# Patient Record
Sex: Female | Born: 1995 | Race: White | Hispanic: Yes | Marital: Single | Smoking: Former smoker
Health system: Southern US, Community
[De-identification: ages and names within clinical notes are randomized; demographics above are authoritative.]

## PROBLEM LIST (undated history)

## (undated) ENCOUNTER — Ambulatory Visit: Admission: EM | Source: Home / Self Care

## (undated) DIAGNOSIS — R51 Headache: Secondary | ICD-10-CM

## (undated) DIAGNOSIS — F32A Depression, unspecified: Secondary | ICD-10-CM

## (undated) DIAGNOSIS — F329 Major depressive disorder, single episode, unspecified: Secondary | ICD-10-CM

## (undated) DIAGNOSIS — L819 Disorder of pigmentation, unspecified: Secondary | ICD-10-CM

## (undated) DIAGNOSIS — S060X9A Concussion with loss of consciousness of unspecified duration, initial encounter: Secondary | ICD-10-CM

## (undated) DIAGNOSIS — S060XAA Concussion with loss of consciousness status unknown, initial encounter: Secondary | ICD-10-CM

## (undated) DIAGNOSIS — G43909 Migraine, unspecified, not intractable, without status migrainosus: Secondary | ICD-10-CM

## (undated) DIAGNOSIS — F419 Anxiety disorder, unspecified: Secondary | ICD-10-CM

## (undated) DIAGNOSIS — R519 Headache, unspecified: Secondary | ICD-10-CM

## (undated) HISTORY — DX: Depression, unspecified: F32.A

## (undated) HISTORY — PX: TONSILLECTOMY AND ADENOIDECTOMY: SUR1326

## (undated) HISTORY — DX: Anxiety disorder, unspecified: F41.9

## (undated) HISTORY — DX: Major depressive disorder, single episode, unspecified: F32.9

## (undated) HISTORY — DX: Concussion with loss of consciousness of unspecified duration, initial encounter: S06.0X9A

## (undated) HISTORY — DX: Headache, unspecified: R51.9

## (undated) HISTORY — PX: OTHER SURGICAL HISTORY: SHX169

## (undated) HISTORY — DX: Concussion with loss of consciousness status unknown, initial encounter: S06.0XAA

## (undated) HISTORY — DX: Headache: R51

## (undated) HISTORY — DX: Migraine, unspecified, not intractable, without status migrainosus: G43.909

## (undated) HISTORY — DX: Disorder of pigmentation, unspecified: L81.9

---
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
## 2017-09-27 DIAGNOSIS — G43001 Migraine without aura, not intractable, with status migrainosus: Secondary | ICD-10-CM | POA: Diagnosis not present

## 2017-09-27 DIAGNOSIS — Z23 Encounter for immunization: Secondary | ICD-10-CM | POA: Diagnosis not present

## 2017-09-27 DIAGNOSIS — E669 Obesity, unspecified: Secondary | ICD-10-CM | POA: Diagnosis not present

## 2017-09-27 DIAGNOSIS — Z0000 Encounter for general adult medical examination without abnormal findings: Secondary | ICD-10-CM | POA: Diagnosis not present

## 2017-09-27 DIAGNOSIS — Z68.41 Body mass index (BMI) pediatric, greater than or equal to 95th percentile for age: Secondary | ICD-10-CM | POA: Diagnosis not present

## 2018-02-06 ENCOUNTER — Other Ambulatory Visit: Payer: Self-pay

## 2018-02-06 ENCOUNTER — Ambulatory Visit (INDEPENDENT_AMBULATORY_CARE_PROVIDER_SITE_OTHER): Admitting: Family Medicine

## 2018-02-06 VITALS — BP 135/87 | HR 96 | Temp 97.9°F | Ht 61.0 in | Wt 160.0 lb

## 2018-02-06 DIAGNOSIS — R5383 Other fatigue: Secondary | ICD-10-CM | POA: Diagnosis not present

## 2018-02-06 DIAGNOSIS — G43909 Migraine, unspecified, not intractable, without status migrainosus: Secondary | ICD-10-CM

## 2018-02-06 DIAGNOSIS — M7918 Myalgia, other site: Secondary | ICD-10-CM

## 2018-02-06 DIAGNOSIS — K1379 Other lesions of oral mucosa: Secondary | ICD-10-CM

## 2018-02-06 DIAGNOSIS — Z7689 Persons encountering health services in other specified circumstances: Secondary | ICD-10-CM | POA: Diagnosis not present

## 2018-02-06 DIAGNOSIS — F331 Major depressive disorder, recurrent, moderate: Secondary | ICD-10-CM

## 2018-02-06 MED ORDER — NORTRIPTYLINE HCL 25 MG PO CAPS: 25.0000 mg | ORAL_CAPSULE | Freq: Every day | ORAL | 0 refills | Status: DC

## 2018-02-06 MED ORDER — BUPROPION HCL ER (XL) 150 MG PO TB24
150.0000 mg | ORAL_TABLET | Freq: Every day | ORAL | 1 refills | Status: DC
Start: 2018-02-06 — End: 2018-03-20

## 2018-02-06 NOTE — Progress Notes (Signed)
BP 135/87   Pulse 96   Temp 97.9 F (36.6 C) (Oral)   Ht 5\' 1"  (1.549 m)   Wt 160 lb (72.6 kg)   SpO2 100%   BMI 30.23 kg/m    Subjective:    Patient ID: Brooke [name], female    DOB: June 02, 1996, 22 y.o.   MRN: [id number]  HPI: Brooke [name] is a 22 y.o. female  Chief Complaint  Patient presents with  . New Patient (Initial Visit)    pt states have been tired, nausea, headache and not feeling good at all for the last 2 months. States she has been feeling anxious. pt have been taken OTC medications but not help   Pt presents today to establish care.   Several months of fatigue, nausea, lack of motivation. Does have a hx of depression and anxiety, used to take medications back in middle school. Tried zoloft with side effects in the past, states it made her feel jittery.   Only took the amitriptyline a few times which helped her migraines but was too sedating. Still having migraines at least once a week. Takes maxalt prn with some relief.   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   Has had 3 concussions in the past, goes to a NeuroChiropractor for this and was followed by a concussion specialist for a while.   Relevant past medical, surgical, family and social history reviewed and updated as indicated. Interim medical history since our last visit reviewed. Allergies and medications reviewed and updated.  Review of Systems  Per HPI unless specifically indicated above     Objective:    BP 135/87   Pulse 96   Temp 97.9 F (36.6 C) (Oral)   Ht 5\' 1"  (1.549 m)   Wt 160 lb (72.6 kg)   SpO2 100%   BMI 30.23 kg/m   Wt Readings from Last 3 Encounters:  02/06/18 160 lb (72.6 kg) (87 %, Z= 1.14)*   * Growth percentiles are based on CDC (Girls, 2-20 Years) data.    Physical Exam  Constitutional: She is oriented to person, place, and time. She appears well-developed and well-nourished.  HENT:  Head: Atraumatic.  Eyes: Conjunctivae and EOM are  normal.  Neck: Neck supple.  Cardiovascular: Normal rate and regular rhythm.  Pulmonary/Chest: Effort normal and breath sounds normal.  Abdominal: Soft. Bowel sounds are normal. There is no tenderness.  Musculoskeletal: Normal range of motion.  Neurological: She is alert and oriented to person, place, and time. No cranial nerve deficit.  Skin: Skin is warm and dry.  Psychiatric: She has a normal mood and affect. Her behavior is normal.  Nursing note and vitals reviewed.   Results for orders placed or performed in visit on 02/06/18  CBC with Differential/Platelet  Result Value Ref Range   WBC 6.8 3.4 - 10.8 x10E3/uL   RBC 5.00 3.77 - 5.28 x10E6/uL   Hemoglobin 14.5 11.1 - 15.9 g/dL   Hematocrit 08.6 57.8 - 46.6 %   MCV 89 79 - 97 fL   MCH 29.0 26.6 - 33.0 pg   MCHC 32.5 31.5 - 35.7 g/dL   RDW 46.9 62.9 - 52.8 %   Platelets 294 150 - 450 x10E3/uL   Neutrophils 64 Not Estab. %   Lymphs 25 Not Estab. %   Monocytes 6 Not Estab. %   Eos 5 Not Estab. %   Basos 0 Not Estab. %   Neutrophils Absolute 4.4 1.4 - 7.0  x10E3/uL   Lymphocytes Absolute 1.7 0.7 - 3.1 x10E3/uL   Monocytes Absolute 0.4 0.1 - 0.9 x10E3/uL   EOS (ABSOLUTE) 0.3 0.0 - 0.4 x10E3/uL   Basophils Absolute 0.0 0.0 - 0.2 x10E3/uL   Immature Granulocytes 0 Not Estab. %   Immature Grans (Abs) 0.0 0.0 - 0.1 x10E3/uL  TSH  Result Value Ref Range   TSH 1.470 0.450 - 4.500 uIU/mL  Rocky mtn spotted fvr abs pnl(IgG+IgM)  Result Value Ref Range   RMSF IgG Negative Negative   RMSF IgM 1.16 (H) 0.00 - 0.89 index  Lyme Ab/Western Blot Reflex  Result Value Ref Range   Lyme IgG/IgM Ab <0.91 0.00 - 0.90 ISR   LYME DISEASE AB, QUANT, IGM <0.80 0.00 - 0.79 index  Vitamin D (25 hydroxy)  Result Value Ref Range   Vit D, 25-Hydroxy 15.7 (L) 30.0 - 100.0 ng/mL  Vitamin B12  Result Value Ref Range   Vitamin B-12 448 232 - 1,245 pg/mL  HSV(herpes simplex vrs) 1+2 ab-IgG  Result Value Ref Range   HSV 1 Glycoprotein G Ab, IgG <0.91  0.00 - 0.90 index   HSV 2 IgG, Type Spec 3.19 (H) 0.00 - 0.90 index  HSV-2 IgG Supplemental Test  Result Value Ref Range   HSV-2 IgG Supplemental Test Negative Negative      Assessment & Plan:   Problem List Items Addressed This Visit      Cardiovascular and Mediastinum   Migraine    Will switch to nortriptyline and monitor for sedation. Continue prn maxalt and phenergan for migraines      Relevant Medications   rizatriptan (MAXALT-MLT) 5 MG disintegrating tablet   acetaminophen (TYLENOL) 325 MG tablet   naproxen (NAPROSYN) 500 MG tablet   nortriptyline (PAMELOR) 25 MG capsule   buPROPion (WELLBUTRIN XL) 150 MG 24 hr tablet     Other   Amplified musculoskeletal pain syndrome    Managed with OTC pain relievers and exercise       Depression    Start wellbutrin. Recommended counseling, pt declines      Relevant Medications   nortriptyline (PAMELOR) 25 MG capsule   buPROPion (WELLBUTRIN XL) 150 MG 24 hr tablet    Other Visit Diagnoses    Fatigue, unspecified type    -  Primary   Unclear etiology, suspect her depression causing but will get labs to r/o organic causes. Start wellbutrin and adjust therapy depending on lab results   Relevant Orders   CBC with Differential/Platelet (Completed)   TSH (Completed)   Rocky mtn spotted fvr abs pnl(IgG+IgM) (Completed)   Lyme Ab/Western Blot Reflex (Completed)   Vitamin D (25 hydroxy) (Completed)   Vitamin B12 (Completed)   Encounter to establish care       Mouth sore       Noted on exam. Will check for HSV and tx if needed. Vaseline for protective barrier in meantime   Relevant Orders   HSV(herpes simplex vrs) 1+2 ab-IgG (Completed)       Follow up plan: Return in about 4 weeks (around 03/06/2018) for Mood f/u.

## 2018-02-08 ENCOUNTER — Other Ambulatory Visit: Payer: Self-pay | Admitting: Family Medicine

## 2018-02-08 LAB — TSH: TSH: 1.47 u[IU]/mL (ref 0.450–4.500)

## 2018-02-08 LAB — LYME AB/WESTERN BLOT REFLEX: Lyme IgG/IgM Ab: <0.91 {ISR} (ref 0.00–0.90)

## 2018-02-08 LAB — CBC WITH DIFFERENTIAL/PLATELET
BASOS ABS: 0 10*3/uL (ref 0.0–0.2)
Basos: 0 %
EOS (ABSOLUTE): 0.3 10*3/uL (ref 0.0–0.4)
Eos: 5 %
Hematocrit: 44.6 % (ref 34.0–46.6)
Hemoglobin: 14.5 g/dL (ref 11.1–15.9)
IMMATURE GRANS (ABS): 0 10*3/uL (ref 0.0–0.1)
Immature Granulocytes: 0 %
LYMPHS: 25 %
Lymphocytes Absolute: 1.7 10*3/uL (ref 0.7–3.1)
MCH: 29 pg (ref 26.6–33.0)
MCHC: 32.5 g/dL (ref 31.5–35.7)
MCV: 89 fL (ref 79–97)
MONOCYTES: 6 %
Monocytes Absolute: 0.4 10*3/uL (ref 0.1–0.9)
NEUTROS ABS: 4.4 10*3/uL (ref 1.4–7.0)
Neutrophils: 64 %
Platelets: 294 10*3/uL (ref 150–450)
RBC: 5 x10E6/uL (ref 3.77–5.28)
RDW: 13 % (ref 12.3–15.4)
WBC: 6.8 10*3/uL (ref 3.4–10.8)

## 2018-02-08 LAB — HSV(HERPES SIMPLEX VRS) I + II AB-IGG
HSV 1 Glycoprotein G Ab, IgG: <0.91 index (ref 0.00–0.90)
HSV 2 IgG, Type Spec: 3.19 index — ABNORMAL HIGH (ref 0.00–0.90)

## 2018-02-08 LAB — VITAMIN B12: Vitamin B-12: 448 pg/mL (ref 232–1245)

## 2018-02-08 LAB — ROCKY MTN SPOTTED FVR ABS PNL(IGG+IGM)
RMSF IGG: NEGATIVE
RMSF IGM: 1.16 {index} — AB (ref 0.00–0.89)

## 2018-02-08 LAB — VITAMIN D 25 HYDROXY (VIT D DEFICIENCY, FRACTURES): VIT D 25 HYDROXY: 15.7 ng/mL — AB (ref 30.0–100.0)

## 2018-02-08 LAB — HSV-2 IGG SUPPLEMENTAL TEST: HSV-2 IGG SUPPLEMENTAL TEST: NEGATIVE

## 2018-02-08 MED ORDER — DOXYCYCLINE HYCLATE 100 MG PO TABS: 100.0000 mg | ORAL_TABLET | Freq: Two times a day (BID) | ORAL | 0 refills | Status: DC

## 2018-02-08 MED ORDER — VITAMIN D (ERGOCALCIFEROL) 1.25 MG (50000 UNIT) PO CAPS: 50000.0000 [IU] | ORAL_CAPSULE | ORAL | 0 refills | Status: DC

## 2018-02-09 DIAGNOSIS — G43909 Migraine, unspecified, not intractable, without status migrainosus: Secondary | ICD-10-CM | POA: Insufficient documentation

## 2018-02-09 DIAGNOSIS — F32A Depression, unspecified: Secondary | ICD-10-CM | POA: Insufficient documentation

## 2018-02-09 DIAGNOSIS — F329 Major depressive disorder, single episode, unspecified: Secondary | ICD-10-CM | POA: Insufficient documentation

## 2018-02-09 NOTE — Assessment & Plan Note (Signed)
Start wellbutrin. Recommended counseling, pt declines

## 2018-02-09 NOTE — Assessment & Plan Note (Signed)
Will switch to nortriptyline and monitor for sedation. Continue prn maxalt and phenergan for migraines

## 2018-02-09 NOTE — Patient Instructions (Signed)
Follow up in 1 month   

## 2018-02-09 NOTE — Assessment & Plan Note (Signed)
Managed with OTC pain relievers and exercise

## 2018-02-13 ENCOUNTER — Encounter: Payer: Self-pay | Admitting: Family Medicine

## 2018-02-20 ENCOUNTER — Telehealth: Payer: Self-pay

## 2018-02-20 NOTE — Telephone Encounter (Signed)
Copied from CRM [phone]. Topic: General - Other >> Feb 20, 2018 10:18 AM Elliot GaultBell, Tiffany M wrote: Relation to pt: self  Call back number: [phone]  Reason for call:  Patient returning call regarding lab results, patient chart has been updated with correct #, please advise

## 2018-02-20 NOTE — Telephone Encounter (Signed)
We have sent patient a letter. Will call patient about her lab results.

## 2018-02-21 MED ORDER — DOXYCYCLINE HYCLATE 100 MG PO TABS: 100.0000 mg | ORAL_TABLET | Freq: Two times a day (BID) | ORAL | 0 refills | Status: DC

## 2018-02-21 MED ORDER — VITAMIN D (ERGOCALCIFEROL) 1.25 MG (50000 UNIT) PO CAPS: 50000.0000 [IU] | ORAL_CAPSULE | ORAL | 0 refills | Status: DC

## 2018-02-21 NOTE — Telephone Encounter (Signed)
Tried calling pt twice, no answer and no VM box.

## 2018-02-21 NOTE — Telephone Encounter (Signed)
Patient called in and stated that she has questions about the lab results. I read the results from the labs and letter sent to patient. Patient and her girlfriend were both on the line and stated that they do not understand how the patient got the RMSF because they state that they have done some research on this. They state that the patient has had the symptoms longer than it states from RMSF so they do not understand where it came from. I asked if she had a tick on her or anything and she stated not that they know of. Patient and her girlfriend requested to speak directly to Fleet ContrasRachel to discuss results better. They would also like the doxycycline, vitamin D, and a cold sore virus medication [facility] [place] purposes. Patient states that she has been having some cold sores in her mouth. Pharmacy updated in chart.

## 2018-03-15 ENCOUNTER — Ambulatory Visit: Payer: Self-pay | Admitting: Family Medicine

## 2018-03-20 ENCOUNTER — Encounter: Payer: Self-pay | Admitting: Family Medicine

## 2018-03-20 ENCOUNTER — Ambulatory Visit (INDEPENDENT_AMBULATORY_CARE_PROVIDER_SITE_OTHER): Payer: Medicaid Other | Admitting: Family Medicine

## 2018-03-20 VITALS — BP 128/76 | HR 101 | Temp 98.3°F | Ht 61.0 in | Wt 168.9 lb

## 2018-03-20 DIAGNOSIS — Z23 Encounter for immunization: Secondary | ICD-10-CM

## 2018-03-20 DIAGNOSIS — F331 Major depressive disorder, recurrent, moderate: Secondary | ICD-10-CM | POA: Diagnosis not present

## 2018-03-20 DIAGNOSIS — G43909 Migraine, unspecified, not intractable, without status migrainosus: Secondary | ICD-10-CM | POA: Diagnosis not present

## 2018-03-20 DIAGNOSIS — E559 Vitamin D deficiency, unspecified: Secondary | ICD-10-CM | POA: Diagnosis not present

## 2018-03-20 MED ORDER — RIZATRIPTAN BENZOATE 5 MG PO TBDP: ORAL_TABLET | ORAL | 6 refills | Status: DC

## 2018-03-20 MED ORDER — NORTRIPTYLINE HCL 50 MG PO CAPS: 50.0000 mg | ORAL_CAPSULE | Freq: Every day | ORAL | 3 refills | Status: DC

## 2018-03-20 MED ORDER — BUSPIRONE HCL 7.5 MG PO TABS: 7.5000 mg | ORAL_TABLET | Freq: Two times a day (BID) | ORAL | 1 refills | Status: DC

## 2018-03-20 MED ORDER — PROMETHAZINE HCL 25 MG PO TABS: 25.0000 mg | ORAL_TABLET | Freq: Three times a day (TID) | ORAL | 6 refills | Status: DC | PRN

## 2018-03-20 NOTE — Assessment & Plan Note (Signed)
Doing well on vitamin D supplementation, will recheck levels at CPE

## 2018-03-20 NOTE — Telephone Encounter (Signed)
Patient here 03/20/2018 for appointment.  Patient had telephone problems for a while which is why we were unable to contact her.   Questions and results reviewed at today's visit.

## 2018-03-20 NOTE — Assessment & Plan Note (Signed)
Increase nortriptyline and refill phenergan and maxalt. Call if not improving

## 2018-03-20 NOTE — Progress Notes (Signed)
BP 128/76 (BP Location: Right Arm, Patient Position: Sitting, Cuff Size: Normal)   Pulse (!) 101   Temp 98.3 F (36.8 C) (Oral)   Ht 5\' 1"  (1.549 m)   Wt 168 lb 14.4 oz (76.6 kg)   SpO2 100%   BMI 31.91 kg/m    Subjective:    Patient ID: Brooke [name], female    DOB: [birth year], 22 y.o.   MRN: [id number]  HPI: Brooke [name] is a 22 y.o. female  Chief Complaint  Patient presents with  . Depression    No changes. Patient stated Wellbutrin made her so anxious she couldn't go back to work.   Marland Kitchen RMSF    Had questions (see telephone encounter) about RMSF because she doesn't remember getting bit by a tick. Patient had telephone problems.    Here today for 1 month f/u. Feeling some better overall as far as moods and energy level, working less and stopped going to school. Started exercising and eating better. Started the vitamin D supplement. Taking the doxyxycline but not regularly so stil lhas some left for the positive RMSF test and thinks this may be helping.   Started nortriptyline, less frequent migraines but 10/10 when happening. Thinks it's because she's out of maxalt and phenergan as well though. Denies visual issues, syncope, hearing issues, inability to get rid of headaches.   Tried 3 days of wellbutrin but became so anxious on it that she could not even go to work. Stopped the medication and has just been working on lifestyle changes to help with anxiety and depression. Is interested in trying something else.   Depression screen [facility] 2/9 03/20/2018 02/06/2018  Decreased Interest 0 1  Down, Depressed, Hopeless 1 1  PHQ - 2 Score 1 2  Altered sleeping 1 1  Tired, decreased energy 2 2  Change in appetite 0 0  Feeling bad or failure about yourself  0 2  Trouble concentrating 1 0  Moving slowly or fidgety/restless 0 0  Suicidal thoughts 0 0  PHQ-9 Score 5 7   GAD 7 : Generalized Anxiety Score 03/20/2018 02/06/2018  Nervous, Anxious, on Edge 0 1  Control/stop worrying 1 1    Worry too much - different things 1 1  Trouble relaxing 1 1  Restless 0 0  Easily annoyed or irritable 1 1  Afraid - awful might happen 0 0  Total GAD 7 Score 4 5     Past Medical History:  Diagnosis Date  . Anxiety   . Concussion   . Depression   . Dyschromia   . Head ache   . Migraine    Social History   Socioeconomic History  . Marital status: Single    Spouse name: Not on file  . Number of children: Not on file  . Years of education: Not on file  . Highest education level: Not on file  Occupational History  . Not on file  Social Needs  . Financial resource strain: Not on file  . Food insecurity:    Worry: Not on file    Inability: Not on file  . Transportation needs:    Medical: Not on file    Non-medical: Not on file  Tobacco Use  . Smoking status: Former Games developer  . Smokeless tobacco: Never Used  Substance and Sexual Activity  . Alcohol use: Yes  . Drug use: Yes  . Sexual activity: Yes  Lifestyle  . Physical activity:    Days per week: Not on file  Minutes per session: Not on file  . Stress: Not on file  Relationships  . Social connections:    Talks on phone: Not on file    Gets together: Not on file    Attends religious service: Not on file    Active member of club or organization: Not on file    Attends meetings of clubs or organizations: Not on file    Relationship status: Not on file  . Intimate partner violence:    Fear of current or ex partner: Not on file    Emotionally abused: Not on file    Physically abused: Not on file    Forced sexual activity: Not on file  Other Topics Concern  . Not on file  Social History Narrative  . Not on file    Relevant past medical, surgical, family and social history reviewed and updated as indicated. Interim medical history since our last visit reviewed. Allergies and medications reviewed and updated.  Review of Systems  Per HPI unless specifically indicated above     Objective:    BP 128/76  (BP Location: Right Arm, Patient Position: Sitting, Cuff Size: Normal)   Pulse (!) 101   Temp 98.3 F (36.8 C) (Oral)   Ht 5\' 1"  (1.549 m)   Wt 168 lb 14.4 oz (76.6 kg)   SpO2 100%   BMI 31.91 kg/m   Wt Readings from Last 3 Encounters:  03/20/18 168 lb 14.4 oz (76.6 kg) (91 %, Z= 1.35)*  02/06/18 160 lb (72.6 kg) (87 %, Z= 1.14)*   * Growth percentiles are based on CDC (Girls, 2-20 Years) data.    Physical Exam  Constitutional: She is oriented to person, place, and time. She appears well-developed and well-nourished. No distress.  HENT:  Head: Atraumatic.  Eyes: Conjunctivae and EOM are normal.  Neck: Normal range of motion. Neck supple.  Cardiovascular: Normal rate, regular rhythm and normal heart sounds.  Pulmonary/Chest: Effort normal and breath sounds normal.  Musculoskeletal: Normal range of motion. She exhibits no edema or deformity.  Neurological: She is alert and oriented to person, place, and time.  Skin: Skin is warm and dry.  Psychiatric: She has a normal mood and affect. Her behavior is normal.  Nursing note and vitals reviewed.   Results for orders placed or performed in visit on 02/06/18  CBC with Differential/Platelet  Result Value Ref Range   WBC 6.8 3.4 - 10.8 x10E3/uL   RBC 5.00 3.77 - 5.28 x10E6/uL   Hemoglobin 14.5 11.1 - 15.9 g/dL   Hematocrit 86.5 78.4 - 46.6 %   MCV 89 79 - 97 fL   MCH 29.0 26.6 - 33.0 pg   MCHC 32.5 31.5 - 35.7 g/dL   RDW 69.6 29.5 - 28.4 %   Platelets 294 150 - 450 x10E3/uL   Neutrophils 64 Not Estab. %   Lymphs 25 Not Estab. %   Monocytes 6 Not Estab. %   Eos 5 Not Estab. %   Basos 0 Not Estab. %   Neutrophils Absolute 4.4 1.4 - 7.0 x10E3/uL   Lymphocytes Absolute 1.7 0.7 - 3.1 x10E3/uL   Monocytes Absolute 0.4 0.1 - 0.9 x10E3/uL   EOS (ABSOLUTE) 0.3 0.0 - 0.4 x10E3/uL   Basophils Absolute 0.0 0.0 - 0.2 x10E3/uL   Immature Granulocytes 0 Not Estab. %   Immature Grans (Abs) 0.0 0.0 - 0.1 x10E3/uL  TSH  Result Value Ref  Range   TSH 1.470 0.450 - 4.500 uIU/mL  Rocky mtn spotted  fvr abs pnl(IgG+IgM)  Result Value Ref Range   RMSF IgG Negative Negative   RMSF IgM 1.16 (H) 0.00 - 0.89 index  Lyme Ab/Western Blot Reflex  Result Value Ref Range   Lyme IgG/IgM Ab <0.91 0.00 - 0.90 ISR   LYME DISEASE AB, QUANT, IGM <0.80 0.00 - 0.79 index  Vitamin D (25 hydroxy)  Result Value Ref Range   Vit D, 25-Hydroxy 15.7 (L) 30.0 - 100.0 ng/mL  Vitamin B12  Result Value Ref Range   Vitamin B-12 448 232 - 1,245 pg/mL  HSV(herpes simplex vrs) 1+2 ab-IgG  Result Value Ref Range   HSV 1 Glycoprotein G Ab, IgG <0.91 0.00 - 0.90 index   HSV 2 IgG, Type Spec 3.19 (H) 0.00 - 0.90 index  HSV-2 IgG Supplemental Test  Result Value Ref Range   HSV-2 IgG Supplemental Test Negative Negative      Assessment & Plan:   Problem List Items Addressed This Visit      Cardiovascular and Mediastinum   Migraine    Increase nortriptyline and refill phenergan and maxalt. Call if not improving      Relevant Medications   nortriptyline (PAMELOR) 50 MG capsule   rizatriptan (MAXALT-MLT) 5 MG disintegrating tablet     Other   Depression    Did not tolerate wellbutrin. Will try buspar and monitor closely. Risks and benefits reviewed. Counseling recommended, pt will consider      Relevant Medications   busPIRone (BUSPAR) 7.5 MG tablet   nortriptyline (PAMELOR) 50 MG capsule   Vitamin D deficiency    Doing well on vitamin D supplementation, will recheck levels at CPE       Other Visit Diagnoses    Need for influenza vaccination    -  Primary   Relevant Orders   Flu Vaccine QUAD 6+ mos PF IM (Fluarix Quad PF) (Completed)       Follow up plan: Return in about 4 weeks (around 04/17/2018) for Mood f/u.

## 2018-03-20 NOTE — Assessment & Plan Note (Signed)
Did not tolerate wellbutrin. Will try buspar and monitor closely. Risks and benefits reviewed. Counseling recommended, pt will consider

## 2018-03-20 NOTE — Patient Instructions (Signed)

## 2018-04-19 ENCOUNTER — Ambulatory Visit: Admitting: Family Medicine

## 2018-05-01 ENCOUNTER — Ambulatory Visit: Admitting: Family Medicine

## 2018-05-02 ENCOUNTER — Encounter: Payer: Self-pay | Admitting: Family Medicine

## 2018-07-09 ENCOUNTER — Telehealth: Payer: Self-pay | Admitting: Family Medicine

## 2018-07-09 DIAGNOSIS — G43909 Migraine, unspecified, not intractable, without status migrainosus: Secondary | ICD-10-CM

## 2018-07-09 NOTE — Telephone Encounter (Signed)
Pt states she needs a new Neurology referral for insurance purposes to Dr. Hughie Closs at [facility]. Already has an appt coming up just needs the referral.

## 2018-10-22 ENCOUNTER — Telehealth: Payer: Self-pay | Admitting: Family Medicine

## 2018-10-22 NOTE — Telephone Encounter (Signed)
Can you check on the referral on this patient. I received a call from [facility] stating the referrals should be going to [facility]. She states the patients mother is upset that this has been happening more than once.  [phone] [facility]  Can you please check on this   Thank you

## 2018-10-22 NOTE — Telephone Encounter (Signed)
Thank you :)

## 2018-10-22 NOTE — Telephone Encounter (Signed)
Referral re faxed to Dr Hughie Closs

## 2018-12-14 ENCOUNTER — Other Ambulatory Visit: Payer: Self-pay

## 2018-12-14 ENCOUNTER — Ambulatory Visit (INDEPENDENT_AMBULATORY_CARE_PROVIDER_SITE_OTHER): Admitting: Family Medicine

## 2018-12-14 ENCOUNTER — Encounter: Payer: Self-pay | Admitting: Family Medicine

## 2018-12-14 VITALS — HR 66 | Ht 61.3 in | Wt 153.0 lb

## 2018-12-14 DIAGNOSIS — G43909 Migraine, unspecified, not intractable, without status migrainosus: Secondary | ICD-10-CM | POA: Diagnosis not present

## 2018-12-14 DIAGNOSIS — G4700 Insomnia, unspecified: Secondary | ICD-10-CM

## 2018-12-14 DIAGNOSIS — F331 Major depressive disorder, recurrent, moderate: Secondary | ICD-10-CM

## 2018-12-14 MED ORDER — PROMETHAZINE HCL 25 MG PO TABS: 25.0000 mg | ORAL_TABLET | Freq: Three times a day (TID) | ORAL | 6 refills | Status: DC | PRN

## 2018-12-14 MED ORDER — TOPIRAMATE 25 MG PO TABS: 25.0000 mg | ORAL_TABLET | Freq: Two times a day (BID) | ORAL | 0 refills | Status: DC

## 2018-12-14 MED ORDER — DULOXETINE HCL 20 MG PO CPEP: 20.0000 mg | ORAL_CAPSULE | Freq: Every day | ORAL | 0 refills | Status: DC

## 2018-12-14 MED ORDER — QUETIAPINE FUMARATE 50 MG PO TABS: 50.0000 mg | ORAL_TABLET | Freq: Every day | ORAL | 0 refills | Status: DC

## 2018-12-14 MED ORDER — RIZATRIPTAN BENZOATE 5 MG PO TBDP: ORAL_TABLET | ORAL | 6 refills | Status: DC

## 2018-12-14 NOTE — Progress Notes (Signed)
Pulse 66    Ht 5' 1.3" (1.557 m)    Wt 153 lb (69.4 kg)    BMI 28.63 kg/m    Subjective:    Patient ID: Brooke [name], female    DOB: [birth year], 23 y.o.   MRN: [id number]  HPI: Brooke Ribasrianna Bresnan is a 23 y.o. female  Chief Complaint  Patient presents with   Depression    pt states she would like to switche the buspirone for something else. states she has not been taking it for about a moth ago     This visit was completed via WebEx due to the restrictions of the COVID-19 pandemic. All issues as above were discussed and addressed. Physical exam was done as above through visual confirmation on WebEx. If it was felt that the patient should be evaluated in the office, they were directed there. The patient verbally consented to this visit.  Location of the patient: home  Location of the provider: work  Those involved with this call:   Provider: Roosvelt Maserachel Jermichael Belmares, PA-C  CMA: Elton SinAnita Quito, CMA  Front Desk/Registration: Harriet PhoJoliza Johnson   Time spent on call: 45 minutes with patient face to face via video conference. More than 50% of this time was spent in counseling and coordination of care. 10 minutes total spent in review of patient's record and preparation of their chart. I verified patient identity using two factors (patient name and date of birth). Patient consents verbally to being seen via telemedicine visit today.   Patient here to discuss multiple issues and wanting to change most of her medications around as she feels her current regimen is not working well for her.   Depression - Stopped the buspirone about a month ago because it was making her feel numb. It did a good job controlling her anxiety, but made her lose her emotions which scared her. Sees a counselor who thinks she needs a new mood stabilizer, antidepressant, and sleep aid. Has been on zoloft - thinks this caused SI as a child, wellbutrin, and can't remember but thinks one or two other antidepressants in the past. Moods  worse since having to put down their older dog a week or so ago. Denies SI/HI, severe mood swings.   Nortriptyline does help with migraines but not with sleep. Notes she will be up until about 3 am or so many nights just laying there worrying and then be up again several hours later when her partner wakes for the day. That along with maxalt prn and phenergan have been controlling her migraines well. Interested in trying something else for sleep. Ambien causes hallucinations, does not want anything like that.   [facility] 12/14/2018 03/20/2018 02/06/2018  Decreased Interest 2 0 1  Down, Depressed, Hopeless 1 1 1   PHQ - 2 Score 3 1 2   Altered sleeping 3 1 1   Tired, decreased energy 2 2 2   Change in appetite 1 0 0  Feeling bad or failure about yourself  1 0 2  Trouble concentrating 1 1 0  Moving slowly or fidgety/restless 0 0 0  Suicidal thoughts 0 0 0  PHQ-9 Score 11 5 7   Difficult doing work/chores Somewhat difficult - -   GAD 7 : Generalized Anxiety Score 03/20/2018 02/06/2018  Nervous, Anxious, on Edge 0 1  Control/stop worrying 1 1  Worry too much - different things 1 1  Trouble relaxing 1 1  Restless 0 0  Easily annoyed or irritable 1 1  Afraid - awful  might happen 0 0  Total GAD 7 Score 4 5   Relevant past medical, surgical, family and social history reviewed and updated as indicated. Interim medical history since our last visit reviewed. Allergies and medications reviewed and updated.  Review of Systems  Per HPI unless specifically indicated above     Objective:    Pulse 66    Ht 5' 1.3" (1.557 m)    Wt 153 lb (69.4 kg)    BMI 28.63 kg/m   Wt Readings from Last 3 Encounters:  12/14/18 153 lb (69.4 kg)  03/20/18 168 lb 14.4 oz (76.6 kg) (91 %, Z= 1.35)*  02/06/18 160 lb (72.6 kg) (87 %, Z= 1.14)*   * Growth percentiles are based on CDC (Girls, 2-20 Years) data.    Physical Exam Vitals signs and nursing note reviewed.  Constitutional:      General: She  is not in acute distress.    Appearance: Normal appearance.  HENT:     Head: Atraumatic.     Right Ear: External ear normal.     Left Ear: External ear normal.     Nose: Nose normal. No congestion.     Mouth/Throat:     Mouth: Mucous membranes are moist.     Pharynx: Oropharynx is clear. No posterior oropharyngeal erythema.  Eyes:     Extraocular Movements: Extraocular movements intact.     Conjunctiva/sclera: Conjunctivae normal.  Neck:     Musculoskeletal: Normal range of motion.  Cardiovascular:     Comments: Unable to assess via virtual visit Pulmonary:     Effort: Pulmonary effort is normal. No respiratory distress.  Musculoskeletal: Normal range of motion.  Skin:    General: Skin is dry.     Findings: No erythema.  Neurological:     Mental Status: She is alert and oriented to person, place, and time.  Psychiatric:        Mood and Affect: Mood normal.        Thought Content: Thought content normal.        Judgment: Judgment normal.     Results for orders placed or performed in visit on 02/06/18  CBC with Differential/Platelet  Result Value Ref Range   WBC 6.8 3.4 - 10.8 x10E3/uL   RBC 5.00 3.77 - 5.28 x10E6/uL   Hemoglobin 14.5 11.1 - 15.9 g/dL   Hematocrit 29.544.6 62.134.0 - 46.6 %   MCV 89 79 - 97 fL   MCH 29.0 26.6 - 33.0 pg   MCHC 32.5 31.5 - 35.7 g/dL   RDW 30.813.0 65.712.3 - 84.615.4 %   Platelets 294 150 - 450 x10E3/uL   Neutrophils 64 Not Estab. %   Lymphs 25 Not Estab. %   Monocytes 6 Not Estab. %   Eos 5 Not Estab. %   Basos 0 Not Estab. %   Neutrophils Absolute 4.4 1.4 - 7.0 x10E3/uL   Lymphocytes Absolute 1.7 0.7 - 3.1 x10E3/uL   Monocytes Absolute 0.4 0.1 - 0.9 x10E3/uL   EOS (ABSOLUTE) 0.3 0.0 - 0.4 x10E3/uL   Basophils Absolute 0.0 0.0 - 0.2 x10E3/uL   Immature Granulocytes 0 Not Estab. %   Immature Grans (Abs) 0.0 0.0 - 0.1 x10E3/uL  TSH  Result Value Ref Range   TSH 1.470 0.450 - 4.500 uIU/mL  Rocky mtn spotted fvr abs pnl(IgG+IgM)  Result Value Ref  Range   RMSF IgG Negative Negative   RMSF IgM 1.16 (H) 0.00 - 0.89 index  Lyme Ab/Western Blot Reflex  Result  Value Ref Range   Lyme IgG/IgM Ab <0.91 0.00 - 0.90 ISR   LYME DISEASE AB, QUANT, IGM <0.80 0.00 - 0.79 index  Vitamin D (25 hydroxy)  Result Value Ref Range   Vit D, 25-Hydroxy 15.7 (L) 30.0 - 100.0 ng/mL  Vitamin B12  Result Value Ref Range   Vitamin B-12 448 232 - 1,245 pg/mL  HSV(herpes simplex vrs) 1+2 ab-IgG  Result Value Ref Range   HSV 1 Glycoprotein G Ab, IgG <0.91 0.00 - 0.90 index   HSV 2 IgG, Type Spec 3.19 (H) 0.00 - 0.90 index  HSV-2 IgG Supplemental Test  Result Value Ref Range   HSV-2 IgG Supplemental Test Negative Negative      Assessment & Plan:   Problem List Items Addressed This Visit      Cardiovascular and Mediastinum   Migraine    Nortriptyline had been helping, but will trial topamax instead given poor sleep quality of nortriptyline. Continue maxalt prn. F/u in 1 month for recheck      Relevant Medications   topiramate (TOPAMAX) 25 MG tablet   DULoxetine (CYMBALTA) 20 MG capsule   rizatriptan (MAXALT-MLT) 5 MG disintegrating tablet     Other   Depression - Primary    Buspar caused numbness of emotions, continue d/c. Trial cymbalta, continue counseling sessions regularly. Seroquel at bedtime for sleep and moods. F/u in 1 month      Relevant Medications   DULoxetine (CYMBALTA) 20 MG capsule   Insomnia    Nortriptyline not helpful for her sleep. Will trial low dose seroquel and titrate up as tolerated. Sleep hygiene reviewed, and hoping once anxiety is under better control sleep will improve additionally          Follow up plan: Return in about 4 weeks (around 01/11/2019) for Mood, sleep, migraine f/u.

## 2018-12-20 DIAGNOSIS — G4700 Insomnia, unspecified: Secondary | ICD-10-CM | POA: Insufficient documentation

## 2018-12-20 NOTE — Assessment & Plan Note (Signed)
Buspar caused numbness of emotions, continue d/c. Trial cymbalta, continue counseling sessions regularly. Seroquel at bedtime for sleep and moods. F/u in 1 month

## 2018-12-20 NOTE — Assessment & Plan Note (Signed)
Nortriptyline not helpful for her sleep. Will trial low dose seroquel and titrate up as tolerated. Sleep hygiene reviewed, and hoping once anxiety is under better control sleep will improve additionally

## 2018-12-20 NOTE — Assessment & Plan Note (Signed)
Nortriptyline had been helping, but will trial topamax instead given poor sleep quality of nortriptyline. Continue maxalt prn. F/u in 1 month for recheck

## 2018-12-31 ENCOUNTER — Encounter: Payer: Self-pay | Admitting: Family Medicine

## 2018-12-31 ENCOUNTER — Ambulatory Visit (INDEPENDENT_AMBULATORY_CARE_PROVIDER_SITE_OTHER): Admitting: Family Medicine

## 2018-12-31 ENCOUNTER — Other Ambulatory Visit: Payer: Self-pay

## 2018-12-31 VITALS — Temp 97.7°F | Ht 61.0 in | Wt 148.0 lb

## 2018-12-31 DIAGNOSIS — H539 Unspecified visual disturbance: Secondary | ICD-10-CM | POA: Diagnosis not present

## 2018-12-31 DIAGNOSIS — G43909 Migraine, unspecified, not intractable, without status migrainosus: Secondary | ICD-10-CM | POA: Diagnosis not present

## 2018-12-31 DIAGNOSIS — S060X0A Concussion without loss of consciousness, initial encounter: Secondary | ICD-10-CM | POA: Diagnosis not present

## 2018-12-31 DIAGNOSIS — F331 Major depressive disorder, recurrent, moderate: Secondary | ICD-10-CM

## 2018-12-31 DIAGNOSIS — R2689 Other abnormalities of gait and mobility: Secondary | ICD-10-CM | POA: Diagnosis not present

## 2018-12-31 DIAGNOSIS — G4700 Insomnia, unspecified: Secondary | ICD-10-CM | POA: Diagnosis not present

## 2018-12-31 MED ORDER — DULOXETINE HCL 30 MG PO CPEP: 30.0000 mg | ORAL_CAPSULE | Freq: Every day | ORAL | 0 refills | Status: DC

## 2018-12-31 MED ORDER — QUETIAPINE FUMARATE 100 MG PO TABS: 100.0000 mg | ORAL_TABLET | Freq: Every day | ORAL | 0 refills | Status: DC

## 2018-12-31 NOTE — Progress Notes (Signed)
Temp 97.7 F (36.5 C) (Tympanic)   Ht 5\' 1"  (1.549 m)   Wt 148 lb (67.1 kg)   BMI 27.96 kg/m    Subjective:    Patient ID: Brooke [name], female    DOB: 10/10/1995, 23 y.o.   MRN: [id number]  HPI: Brooke [name] is a 23 y.o. female  Chief Complaint  Patient presents with  . Depression    Medication management    . This visit was completed via WebEx due to the restrictions of the COVID-19 pandemic. All issues as above were discussed and addressed. Physical exam was done as above through visual confirmation on WebEx. If it was felt that the patient should be evaluated in the office, they were directed there. The patient verbally consented to this visit. . Location of the patient: in car . Location of the provider: home . Those involved with this call:  . Provider: Merrie Roof, PA-C . CMA: Merilyn Baba, [place] . Front Desk/Registration: Jill Side  . Time spent on call: 25 minutes with patient face to face via video conference. More than 50% of this time was spent in counseling and coordination of care. 5 minutes total spent in review of patient's record and preparation of their chart. I verified patient identity using two factors (patient name and date of birth). Patient consents verbally to being seen via telemedicine visit today.   Patient started on duloxetine about 2 weeks ago and feels like she's very irritable and agitated in the evenings, but does note she's doing well on it during the daytime. Denies any side effects. No SI/HI.   Feels the seroquel is helping with sleep but may not be strong enough. Tolerating well so far and does sleep more than before on it.   Topamax doing very well for migraine control. Also following with a concussion specialist who is agreeable to her current regimen. No side effects.   Depression screen [facility] 2/9 12/31/2018 12/14/2018 03/20/2018  Decreased Interest 2 2 0  Down, Depressed, Hopeless 1 1 1   PHQ - 2 Score 3 3 1   Altered sleeping 2 3  1   Tired, decreased energy 2 2 2   Change in appetite 2 1 0  Feeling bad or failure about yourself  1 1 0  Trouble concentrating 0 1 1  Moving slowly or fidgety/restless 0 0 0  Suicidal thoughts 0 0 0  PHQ-9 Score 10 11 5   Difficult doing work/chores Somewhat difficult Somewhat difficult -   GAD 7 : Generalized Anxiety Score 03/20/2018 02/06/2018  Nervous, Anxious, on Edge 0 1  Control/stop worrying 1 1  Worry too much - different things 1 1  Trouble relaxing 1 1  Restless 0 0  Easily annoyed or irritable 1 1  Afraid - awful might happen 0 0  Total GAD 7 Score 4 5   Relevant past medical, surgical, family and social history reviewed and updated as indicated. Interim medical history since our last visit reviewed. Allergies and medications reviewed and updated.  Review of Systems  Per HPI unless specifically indicated above     Objective:    Temp 97.7 F (36.5 C) (Tympanic)   Ht 5\' 1"  (1.549 m)   Wt 148 lb (67.1 kg)   BMI 27.96 kg/m   Wt Readings from Last 3 Encounters:  12/31/18 148 lb (67.1 kg)  12/14/18 153 lb (69.4 kg)  03/20/18 168 lb 14.4 oz (76.6 kg) (91 %, Z= 1.35)*   * Growth percentiles are based on CDC (Girls,  2-20 Years) data.    Physical Exam Vitals signs and nursing note reviewed.  Constitutional:      General: She is not in acute distress.    Appearance: Normal appearance.  HENT:     Head: Atraumatic.     Right Ear: External ear normal.     Left Ear: External ear normal.     Nose: Nose normal. No congestion.     Mouth/Throat:     Mouth: Mucous membranes are moist.     Pharynx: Oropharynx is clear. No posterior oropharyngeal erythema.  Eyes:     Extraocular Movements: Extraocular movements intact.     Conjunctiva/sclera: Conjunctivae normal.  Neck:     Musculoskeletal: Normal range of motion.  Cardiovascular:     Comments: Unable to assess via virtual visit Pulmonary:     Effort: Pulmonary effort is normal. No respiratory distress.   Musculoskeletal: Normal range of motion.  Skin:    General: Skin is dry.     Findings: No erythema.  Neurological:     Mental Status: She is alert and oriented to person, place, and time.  Psychiatric:        Mood and Affect: Mood normal.        Thought Content: Thought content normal.        Judgment: Judgment normal.     Results for orders placed or performed in visit on 02/06/18  CBC with Differential/Platelet  Result Value Ref Range   WBC 6.8 3.4 - 10.8 x10E3/uL   RBC 5.00 3.77 - 5.28 x10E6/uL   Hemoglobin 14.5 11.1 - 15.9 g/dL   Hematocrit 16.144.6 09.634.0 - 46.6 %   MCV 89 79 - 97 fL   MCH 29.0 26.6 - 33.0 pg   MCHC 32.5 31.5 - 35.7 g/dL   RDW 04.513.0 40.912.3 - 81.115.4 %   Platelets 294 150 - 450 x10E3/uL   Neutrophils 64 Not Estab. %   Lymphs 25 Not Estab. %   Monocytes 6 Not Estab. %   Eos 5 Not Estab. %   Basos 0 Not Estab. %   Neutrophils Absolute 4.4 1.4 - 7.0 x10E3/uL   Lymphocytes Absolute 1.7 0.7 - 3.1 x10E3/uL   Monocytes Absolute 0.4 0.1 - 0.9 x10E3/uL   EOS (ABSOLUTE) 0.3 0.0 - 0.4 x10E3/uL   Basophils Absolute 0.0 0.0 - 0.2 x10E3/uL   Immature Granulocytes 0 Not Estab. %   Immature Grans (Abs) 0.0 0.0 - 0.1 x10E3/uL  TSH  Result Value Ref Range   TSH 1.470 0.450 - 4.500 uIU/mL  Rocky mtn spotted fvr abs pnl(IgG+IgM)  Result Value Ref Range   RMSF IgG Negative Negative   RMSF IgM 1.16 (H) 0.00 - 0.89 index  Lyme Ab/Western Blot Reflex  Result Value Ref Range   Lyme IgG/IgM Ab <0.91 0.00 - 0.90 ISR   LYME DISEASE AB, QUANT, IGM <0.80 0.00 - 0.79 index  Vitamin D (25 hydroxy)  Result Value Ref Range   Vit D, 25-Hydroxy 15.7 (L) 30.0 - 100.0 ng/mL  Vitamin B12  Result Value Ref Range   Vitamin B-12 448 232 - 1,245 pg/mL  HSV(herpes simplex vrs) 1+2 ab-IgG  Result Value Ref Range   HSV 1 Glycoprotein G Ab, IgG <0.91 0.00 - 0.90 index   HSV 2 IgG, Type Spec 3.19 (H) 0.00 - 0.90 index  HSV-2 IgG Supplemental Test  Result Value Ref Range   HSV-2 IgG Supplemental  Test Negative Negative      Assessment & Plan:   Problem  List Items Addressed This Visit      Cardiovascular and Mediastinum   Migraine    So far doing really well on the topamax, continue current regimen. Has maxalt for prn use for breakthrough sxs      Relevant Medications   DULoxetine (CYMBALTA) 30 MG capsule     Other   Depression - Primary    So far tolerating cymbalta well, but may be dealing with the medicine wearing off too soon during the day. Will increase dose to 30 mg daily and see if that lasts longer for her. If needed, will start BID dosing       Relevant Medications   DULoxetine (CYMBALTA) 30 MG capsule   Insomnia    So far getting benefit from seroquel. Will increase dose to 100 mg and continue to monitor. Sleep hygiene reviewed          Follow up plan: Return for as scheduled.

## 2019-01-03 NOTE — Assessment & Plan Note (Signed)
So far tolerating cymbalta well, but may be dealing with the medicine wearing off too soon during the day. Will increase dose to 30 mg daily and see if that lasts longer for her. If needed, will start BID dosing

## 2019-01-03 NOTE — Assessment & Plan Note (Addendum)
So far doing really well on the topamax, continue current regimen. Has maxalt for prn use for breakthrough sxs

## 2019-01-03 NOTE — Assessment & Plan Note (Signed)
So far getting benefit from seroquel. Will increase dose to 100 mg and continue to monitor. Sleep hygiene reviewed

## 2019-01-25 ENCOUNTER — Other Ambulatory Visit: Payer: Self-pay | Admitting: Family Medicine

## 2019-01-25 NOTE — Telephone Encounter (Signed)
Routing to provider  

## 2019-01-28 ENCOUNTER — Encounter: Payer: Self-pay | Admitting: Nurse Practitioner

## 2019-01-28 ENCOUNTER — Ambulatory Visit (INDEPENDENT_AMBULATORY_CARE_PROVIDER_SITE_OTHER): Admitting: Nurse Practitioner

## 2019-01-28 ENCOUNTER — Other Ambulatory Visit: Payer: Self-pay

## 2019-01-28 DIAGNOSIS — J029 Acute pharyngitis, unspecified: Secondary | ICD-10-CM | POA: Insufficient documentation

## 2019-01-28 MED ORDER — ONDANSETRON 4 MG PO TBDP: 4.0000 mg | ORAL_TABLET | Freq: Three times a day (TID) | ORAL | 0 refills | Status: DC | PRN

## 2019-01-28 MED ORDER — AZITHROMYCIN 250 MG PO TABS: ORAL_TABLET | ORAL | 0 refills | Status: DC

## 2019-01-28 MED ORDER — FLUCONAZOLE 150 MG PO TABS: 150.0000 mg | ORAL_TABLET | Freq: Once | ORAL | 0 refills | Status: AC

## 2019-01-28 NOTE — Patient Instructions (Signed)
Pharyngitis  Pharyngitis is a sore throat (pharynx). This is when there is redness, pain, and swelling in your throat. Most of the time, this condition gets better on its own. In some cases, you may need medicine. Follow these instructions at home:  Take over-the-counter and prescription medicines only as told by your doctor. ? If you were prescribed an antibiotic medicine, take it as told by your doctor. Do not stop taking the antibiotic even if you start to feel better. ? Do not give children aspirin. Aspirin has been linked to Reye syndrome.  Drink enough water and fluids to keep your pee (urine) clear or pale yellow.  Get a lot of rest.  Rinse your mouth (gargle) with a salt-water mixture 3-4 times a day or as needed. To make a salt-water mixture, completely dissolve -1 tsp of salt in 1 cup of warm water.  If your doctor approves, you may use throat lozenges or sprays to soothe your throat. Contact a doctor if:  You have large, tender lumps in your neck.  You have a rash.  You cough up green, yellow-brown, or bloody spit. Get help right away if:  You have a stiff neck.  You drool or cannot swallow liquids.  You cannot drink or take medicines without throwing up.  You have very bad pain that does not go away with medicine.  You have problems breathing, and it is not from a stuffy nose.  You have new pain and swelling in your knees, ankles, wrists, or elbows. Summary  Pharyngitis is a sore throat (pharynx). This is when there is redness, pain, and swelling in your throat.  If you were prescribed an antibiotic medicine, take it as told by your doctor. Do not stop taking the antibiotic even if you start to feel better.  Most of the time, pharyngitis gets better on its own. Sometimes, you may need medicine. This information is not intended to replace advice given to you by your health care provider. Make sure you discuss any questions you have with your health care  provider. Document Released: 11/09/2007 Document Revised: 05/05/2017 Document Reviewed: 06/28/2016 Elsevier Patient Education  2020 [employer]   

## 2019-01-28 NOTE — Progress Notes (Signed)
There were no vitals taken for this visit.   Subjective:    Patient ID: Brooke [name], female    DOB: 25-Apr-1996, 23 y.o.   MRN: [id number]  HPI: Brooke [name] is a 23 y.o. female  Chief Complaint  Patient presents with  . URI    pt states she is still having fatigue, cough, sore throat, and headaches.     . This visit was completed via WebEx due to the restrictions of the COVID-19 pandemic. All issues as above were discussed and addressed. Physical exam was done as above through visual confirmation on WebEx. If it was felt that the patient should be evaluated in the office, they were directed there. The patient verbally consented to this visit. . Location of the patient: home . Location of the provider: work . Those involved with this call:  . Provider: Marnee Guarneri, DNP . CMA: Yvonna Alanis, CMA . Front Desk/Registration: Jill Side  . Time spent on call: 15 minutes with patient face to face via video conference. More than 50% of this time was spent in counseling and coordination of care. 10 minutes total spent in review of patient's record and preparation of their chart.  . I verified patient identity using two factors (patient name and date of birth). Patient consents verbally to being seen via telemedicine visit today.   UPPER RESPIRATORY TRACT INFECTION Tested negative for Covid at [facility] in [place] on Saturday.  They did not provide medications at visited, tested her and sent "me on the way". Started with sore throat one week ago, just sore throat, then few days later had N&V + fatigue + body aches + headaches.  States the sore throat is still there, but not as bad as it was.  Continues to have nonproductive cough.  Denies loss of taste or loss of smell.  Works in Scientist, research (physical sciences), Therapist, art, at two stores.  Exposure risk present, buts does not recall being around anyone with positive Covid and no recent travel overseas. Fever: hot to touch, but temperature normal  (myalgias) Cough: yes Shortness of breath: no Wheezing: no Chest pain: yes, with cough Chest tightness: no Chest congestion: no Nasal congestion: no Runny nose: no Post nasal drip: no Sneezing: no Sore throat: yes Swollen glands: no Sinus pressure: yes Headache: yes Face pain: no Toothache: no Ear pain: yes bilateral Ear pressure: yes bilateral Eyes red/itching:no Eye drainage/crusting: no  Vomiting: yes Rash: no Fatigue: yes Sick contacts: no Strep contacts: no  Context: fluctuating Recurrent sinusitis: no Relief with OTC cold/cough medications: no  Treatments attempted: Tylenol  Relevant past medical, surgical, family and social history reviewed and updated as indicated. Interim medical history since our last visit reviewed. Allergies and medications reviewed and updated.  Review of Systems  Constitutional: Positive for chills and fatigue. Negative for activity change, appetite change, diaphoresis and fever.  HENT: Positive for congestion, ear pain, sinus pressure, sinus pain and sore throat. Negative for ear discharge, facial swelling, hearing loss, postnasal drip, rhinorrhea, sneezing and voice change.   Eyes: Negative for pain and visual disturbance.  Respiratory: Positive for cough. Negative for chest tightness, shortness of breath and wheezing.   Cardiovascular: Negative for chest pain, palpitations and leg swelling.  Gastrointestinal: Positive for nausea and vomiting. Negative for abdominal distention, abdominal pain, constipation and diarrhea.  Endocrine: Negative.   Musculoskeletal: Positive for myalgias.  Neurological: Positive for headaches. Negative for dizziness and numbness.  Psychiatric/Behavioral: Negative.     Per HPI unless specifically indicated above  Objective:    There were no vitals taken for this visit.  Wt Readings from Last 3 Encounters:  12/31/18 148 lb (67.1 kg)  12/14/18 153 lb (69.4 kg)  03/20/18 168 lb 14.4 oz (76.6 kg) (91 %,  Z= 1.35)*   * Growth percentiles are based on CDC (Girls, 2-20 Years) data.    Physical Exam Vitals signs and nursing note reviewed.  Constitutional:      General: She is awake. She is not in acute distress.    Appearance: She is well-developed. She is ill-appearing.  HENT:     Head: Normocephalic.     Right Ear: Hearing and external ear normal. No drainage.     Left Ear: Hearing and external ear normal. No drainage.     Nose: Nose normal.     Mouth/Throat:     Pharynx: Uvula midline. Posterior oropharyngeal erythema (mild with cobblestoning) present. No pharyngeal swelling or oropharyngeal exudate.     Comments: Viewed via virtual visit Eyes:     General: Lids are normal.        Right eye: No discharge.        Left eye: No discharge.     Conjunctiva/sclera: Conjunctivae normal.  Neck:     Musculoskeletal: Normal range of motion.  Cardiovascular:     Comments: Unable to auscultate due to virtual exam only   Pulmonary:     Effort: Pulmonary effort is normal. No accessory muscle usage or respiratory distress.     Comments: Unable to auscultate due to virtual exam only. No SOB with talking.  Intermittent nonproductive cough noted.  Neurological:     Mental Status: She is alert and oriented to person, place, and time.  Psychiatric:        Attention and Perception: Attention normal.        Mood and Affect: Mood normal.        Behavior: Behavior normal. Behavior is cooperative.        Thought Content: Thought content normal.        Judgment: Judgment normal.     Results for orders placed or performed in visit on 02/06/18  CBC with Differential/Platelet  Result Value Ref Range   WBC 6.8 3.4 - 10.8 x10E3/uL   RBC 5.00 3.77 - 5.28 x10E6/uL   Hemoglobin 14.5 11.1 - 15.9 g/dL   Hematocrit 16.144.6 09.634.0 - 46.6 %   MCV 89 79 - 97 fL   MCH 29.0 26.6 - 33.0 pg   MCHC 32.5 31.5 - 35.7 g/dL   RDW 04.513.0 40.912.3 - 81.115.4 %   Platelets 294 150 - 450 x10E3/uL   Neutrophils 64 Not Estab. %    Lymphs 25 Not Estab. %   Monocytes 6 Not Estab. %   Eos 5 Not Estab. %   Basos 0 Not Estab. %   Neutrophils Absolute 4.4 1.4 - 7.0 x10E3/uL   Lymphocytes Absolute 1.7 0.7 - 3.1 x10E3/uL   Monocytes Absolute 0.4 0.1 - 0.9 x10E3/uL   EOS (ABSOLUTE) 0.3 0.0 - 0.4 x10E3/uL   Basophils Absolute 0.0 0.0 - 0.2 x10E3/uL   Immature Granulocytes 0 Not Estab. %   Immature Grans (Abs) 0.0 0.0 - 0.1 x10E3/uL  TSH  Result Value Ref Range   TSH 1.470 0.450 - 4.500 uIU/mL  Rocky mtn spotted fvr abs pnl(IgG+IgM)  Result Value Ref Range   RMSF IgG Negative Negative   RMSF IgM 1.16 (H) 0.00 - 0.89 index  Lyme Ab/Western Blot Reflex  Result Value Ref  Range   Lyme IgG/IgM Ab <0.91 0.00 - 0.90 ISR   LYME DISEASE AB, QUANT, IGM <0.80 0.00 - 0.79 index  Vitamin D (25 hydroxy)  Result Value Ref Range   Vit D, 25-Hydroxy 15.7 (L) 30.0 - 100.0 ng/mL  Vitamin B12  Result Value Ref Range   Vitamin B-12 448 232 - 1,245 pg/mL  HSV(herpes simplex vrs) 1+2 ab-IgG  Result Value Ref Range   HSV 1 Glycoprotein G Ab, IgG <0.91 0.00 - 0.90 index   HSV 2 IgG, Type Spec 3.19 (H) 0.00 - 0.90 index  HSV-2 IgG Supplemental Test  Result Value Ref Range   HSV-2 IgG Supplemental Test Negative Negative      Assessment & Plan:   Problem List Items Addressed This Visit      Other   Sore throat    Acute and ongoing with recent negative Covid testing.  Due to restrictions unable to perform assessment in office, virtual visit only.  Script for Azithromycin, Diflucan (pt reports yeast with abx), and Zofran sent.  Recommend taking OTC cold/sinus medication and alternating Ibuprofen/Tylenol as needed + salt water gargles and rest.  To maintain quarantine at home until symptoms improved, will provide work note.  Return to office for continue to worsening symptoms.         I discussed the assessment and treatment plan with the patient. The patient was provided an opportunity to ask questions and all were answered. The  patient agreed with the plan and demonstrated an understanding of the instructions.   The patient was advised to call back or seek an in-person evaluation if the symptoms worsen or if the condition fails to improve as anticipated.   I provided 15 minutes of time during this encounter.  Follow up plan: Return if symptoms worsen or fail to improve.

## 2019-01-28 NOTE — Progress Notes (Signed)
Letter out

## 2019-01-28 NOTE — Assessment & Plan Note (Signed)
Acute and ongoing with recent negative Covid testing.  Due to restrictions unable to perform assessment in office, virtual visit only.  Script for Azithromycin, Diflucan (pt reports yeast with abx), and Zofran sent.  Recommend taking OTC cold/sinus medication and alternating Ibuprofen/Tylenol as needed + salt water gargles and rest.  To maintain quarantine at home until symptoms improved, will provide work note.  Return to office for continue to worsening symptoms.

## 2019-02-04 ENCOUNTER — Other Ambulatory Visit: Payer: Self-pay | Admitting: Family Medicine

## 2019-02-04 NOTE — Telephone Encounter (Signed)
Routing to provider  

## 2019-02-04 NOTE — Telephone Encounter (Signed)
Requested medication (s) are due for refill today:yes Requested medication (s) are on the active medication list: yes  Last refill:  727/2020  Future visit scheduled: no  Notes to clinic:  This refill cannot be delegated  Requested Prescriptions  Pending Prescriptions Disp Refills   QUEtiapine (SEROQUEL) 100 MG tablet [Pharmacy Med Name: QUETIAPINE 100MG  TABLETS] 30 tablet 0    Sig: TAKE 1 TABLET(100 MG) BY MOUTH AT BEDTIME     Not Delegated - Psychiatry:  Antipsychotics - Second Generation (Atypical) - quetiapine Failed - 02/04/2019 11:22 AM      Failed - This refill cannot be delegated      Failed - ALT in normal range and within 180 days    No results found for: ALT       Failed - AST in normal range and within 180 days    No results found for: POCAST, AST       Passed - Last BP in normal range    BP Readings from Last 1 Encounters:  03/20/18 128/76         Passed - Valid encounter within last 6 months    Recent Outpatient Visits          1 week ago Sore throat   [place], Kimball T, NP   1 month ago Moderate episode of recurrent major depressive disorder [facility])   [place], [address], [place]   1 month ago Moderate episode of recurrent major depressive disorder [facility])   [place], [place], [place]   10 months ago Need for influenza vaccination   [place], [place]   12 months ago Fatigue, unspecified type   [facility], [place], [place]             Passed - Completed PHQ-2 or PHQ-9 in the last 360 days.

## 2019-02-25 ENCOUNTER — Telehealth: Payer: Self-pay | Admitting: Family Medicine

## 2019-02-25 MED ORDER — RIZATRIPTAN BENZOATE 10 MG PO TABS: ORAL_TABLET | ORAL | 6 refills | Status: DC

## 2019-02-25 MED ORDER — TOPIRAMATE 25 MG PO TABS: 25.0000 mg | ORAL_TABLET | Freq: Two times a day (BID) | ORAL | 2 refills | Status: DC

## 2019-02-25 NOTE — Telephone Encounter (Signed)
Pt requesting increase of maxalt to 10 mg tabs given worsening migrianes as well as refill on topamax. Refills sent

## 2019-02-26 ENCOUNTER — Encounter: Payer: Self-pay | Admitting: Family Medicine

## 2019-02-26 ENCOUNTER — Other Ambulatory Visit: Payer: Self-pay

## 2019-02-26 ENCOUNTER — Ambulatory Visit (INDEPENDENT_AMBULATORY_CARE_PROVIDER_SITE_OTHER): Admitting: Family Medicine

## 2019-02-26 VITALS — HR 76 | Ht 61.0 in | Wt 145.0 lb

## 2019-02-26 DIAGNOSIS — F900 Attention-deficit hyperactivity disorder, predominantly inattentive type: Secondary | ICD-10-CM | POA: Diagnosis not present

## 2019-02-26 DIAGNOSIS — G43909 Migraine, unspecified, not intractable, without status migrainosus: Secondary | ICD-10-CM | POA: Diagnosis not present

## 2019-02-26 MED ORDER — TOPIRAMATE 50 MG PO TABS: 50.0000 mg | ORAL_TABLET | Freq: Two times a day (BID) | ORAL | 2 refills | Status: DC

## 2019-02-26 MED ORDER — DULOXETINE HCL 30 MG PO CPEP: 30.0000 mg | ORAL_CAPSULE | Freq: Every day | ORAL | 1 refills | Status: DC

## 2019-02-26 MED ORDER — ATOMOXETINE HCL 60 MG PO CAPS: 60.0000 mg | ORAL_CAPSULE | Freq: Every day | ORAL | 0 refills | Status: DC

## 2019-02-26 NOTE — Progress Notes (Signed)
Pulse 76   Ht 5\' 1"  (1.549 m)   Wt 145 lb (65.8 kg)   BMI 27.40 kg/m    Subjective:    Patient ID: Brooke [name], female    DOB: [birth year], 23 y.o.   MRN: [id number]  HPI: Brooke [name] is a 23 y.o. female  Chief Complaint  Patient presents with  . Medication Management    Patient states she saw a psychologist ([name] @ [facility]) for ADHD testing. Patient states that Dr. [name] explained to her to contact her PCP because she recommended patient start on ADHD medication .  Marland Kitchen Migraine    Patient states she's been having severe migraines and medication isn't helping.    . This visit was completed via WebEx due to the restrictions of the COVID-19 pandemic. All issues as above were discussed and addressed. Physical exam was done as above through visual confirmation on WebEx. If it was felt that the patient should be evaluated in the office, they were directed there. The patient verbally consented to this visit. . Location of the patient: home . Location of the provider: home . Those involved with this call:  . Provider: Merrie Roof, PA-C . CMA: Merilyn Baba, [place] . Front Desk/Registration: Jill Side  . Time spent on call: 25 minutes with patient face to face via video conference. More than 50% of this time was spent in counseling and coordination of care. 5 minutes total spent in review of patient's record and preparation of their chart. I verified patient identity using two factors (patient name and date of birth). Patient consents verbally to being seen via telemedicine visit today.   2 weeks of worsening headaches, feels different from her usual headaches - pressure and stabbing pain, then dull pain sitting behind eyes. Topamax and maxalt doesn't help. Denies confusion, head trauma, visual changes, vomiting. When asked, notes she's forgotten to take her cymbalta the past 2 weeks.    Went to a Engineer, water at [facility] for some testing related to ADHD and has been diagnosed with  it. Feels this is impacting daily life, brain can never fully focus in on what she's needing it to and she feels like it's constantly racing with thoughts. Would like to try something to help with this.   Depression screen [facility] 2/9 02/26/2019 12/31/2018 12/14/2018  Decreased Interest 2 2 2   Down, Depressed, Hopeless 1 1 1   PHQ - 2 Score 3 3 3   Altered sleeping 0 2 3  Tired, decreased energy 2 2 2   Change in appetite 1 2 1   Feeling bad or failure about yourself  2 1 1   Trouble concentrating 1 0 1  Moving slowly or fidgety/restless 0 0 0  Suicidal thoughts 0 0 0  PHQ-9 Score 9 10 11   Difficult doing work/chores Not difficult at all Somewhat difficult Somewhat difficult    Relevant past medical, surgical, family and social history reviewed and updated as indicated. Interim medical history since our last visit reviewed. Allergies and medications reviewed and updated.  Review of Systems  Per HPI unless specifically indicated above     Objective:    Pulse 76   Ht 5\' 1"  (1.549 m)   Wt 145 lb (65.8 kg)   BMI 27.40 kg/m   Wt Readings from Last 3 Encounters:  02/26/19 145 lb (65.8 kg)  12/31/18 148 lb (67.1 kg)  12/14/18 153 lb (69.4 kg)    Physical Exam Vitals signs and nursing note reviewed.  Constitutional:  General: She is not in acute distress.    Appearance: Normal appearance.  HENT:     Head: Atraumatic.     Right Ear: External ear normal.     Left Ear: External ear normal.     Nose: Nose normal. No congestion.     Mouth/Throat:     Mouth: Mucous membranes are moist.     Pharynx: Oropharynx is clear. No posterior oropharyngeal erythema.  Eyes:     Extraocular Movements: Extraocular movements intact.     Conjunctiva/sclera: Conjunctivae normal.  Neck:     Musculoskeletal: Normal range of motion.  Cardiovascular:     Comments: Unable to assess via virtual visit Pulmonary:     Effort: Pulmonary effort is normal. No respiratory distress.  Musculoskeletal: Normal  range of motion.  Skin:    General: Skin is dry.     Findings: No erythema.  Neurological:     Mental Status: She is alert and oriented to person, place, and time.  Psychiatric:        Mood and Affect: Mood normal.        Thought Content: Thought content normal.        Judgment: Judgment normal.     Results for orders placed or performed in visit on 02/06/18  CBC with Differential/Platelet  Result Value Ref Range   WBC 6.8 3.4 - 10.8 x10E3/uL   RBC 5.00 3.77 - 5.28 x10E6/uL   Hemoglobin 14.5 11.1 - 15.9 g/dL   Hematocrit 09.4 70.9 - 46.6 %   MCV 89 79 - 97 fL   MCH 29.0 26.6 - 33.0 pg   MCHC 32.5 31.5 - 35.7 g/dL   RDW 62.8 36.6 - 29.4 %   Platelets 294 150 - 450 x10E3/uL   Neutrophils 64 Not Estab. %   Lymphs 25 Not Estab. %   Monocytes 6 Not Estab. %   Eos 5 Not Estab. %   Basos 0 Not Estab. %   Neutrophils Absolute 4.4 1.4 - 7.0 x10E3/uL   Lymphocytes Absolute 1.7 0.7 - 3.1 x10E3/uL   Monocytes Absolute 0.4 0.1 - 0.9 x10E3/uL   EOS (ABSOLUTE) 0.3 0.0 - 0.4 x10E3/uL   Basophils Absolute 0.0 0.0 - 0.2 x10E3/uL   Immature Granulocytes 0 Not Estab. %   Immature Grans (Abs) 0.0 0.0 - 0.1 x10E3/uL  TSH  Result Value Ref Range   TSH 1.470 0.450 - 4.500 uIU/mL  Rocky mtn spotted fvr abs pnl(IgG+IgM)  Result Value Ref Range   RMSF IgG Negative Negative   RMSF IgM 1.16 (H) 0.00 - 0.89 index  Lyme Ab/Western Blot Reflex  Result Value Ref Range   Lyme IgG/IgM Ab <0.91 0.00 - 0.90 ISR   LYME DISEASE AB, QUANT, IGM <0.80 0.00 - 0.79 index  Vitamin D (25 hydroxy)  Result Value Ref Range   Vit D, 25-Hydroxy 15.7 (L) 30.0 - 100.0 ng/mL  Vitamin B12  Result Value Ref Range   Vitamin B-12 448 232 - 1,245 pg/mL  HSV(herpes simplex vrs) 1+2 ab-IgG  Result Value Ref Range   HSV 1 Glycoprotein G Ab, IgG <0.91 0.00 - 0.90 index   HSV 2 IgG, Type Spec 3.19 (H) 0.00 - 0.90 index  HSV-2 IgG Supplemental Test  Result Value Ref Range   HSV-2 IgG Supplemental Test Negative Negative       Assessment & Plan:   Problem List Items Addressed This Visit      Cardiovascular and Mediastinum   Migraine - Primary  Suspect exacerbated from withdrawal to cymbalta, patient started back on it yesterday and will work hard on compliance with it. Continue current regimen and monitor for relief of headache within next few days once cymbalta back into her system.       Relevant Medications   topiramate (TOPAMAX) 50 MG tablet   DULoxetine (CYMBALTA) 30 MG capsule     Other   ADHD    WIll start strattera and monitor closely for benefit. Discussed some strategies to help with mental organization          Follow up plan: Return in about 4 weeks (around 03/26/2019) for ADHD and migraine.

## 2019-03-03 DIAGNOSIS — F909 Attention-deficit hyperactivity disorder, unspecified type: Secondary | ICD-10-CM | POA: Insufficient documentation

## 2019-03-03 NOTE — Assessment & Plan Note (Signed)
Suspect exacerbated from withdrawal to cymbalta, patient started back on it yesterday and will work hard on compliance with it. Continue current regimen and monitor for relief of headache within next few days once cymbalta back into her system.

## 2019-03-03 NOTE — Assessment & Plan Note (Signed)
WIll start strattera and monitor closely for benefit. Discussed some strategies to help with mental organization

## 2019-03-11 ENCOUNTER — Ambulatory Visit (INDEPENDENT_AMBULATORY_CARE_PROVIDER_SITE_OTHER): Admitting: Family Medicine

## 2019-03-11 ENCOUNTER — Other Ambulatory Visit: Payer: Self-pay

## 2019-03-11 VITALS — BP 108/68 | HR 91 | Temp 98.5°F | Ht 61.0 in | Wt 145.0 lb

## 2019-03-11 DIAGNOSIS — R202 Paresthesia of skin: Secondary | ICD-10-CM | POA: Diagnosis not present

## 2019-03-11 DIAGNOSIS — Z23 Encounter for immunization: Secondary | ICD-10-CM

## 2019-03-11 DIAGNOSIS — R200 Anesthesia of skin: Secondary | ICD-10-CM | POA: Diagnosis not present

## 2019-03-11 DIAGNOSIS — M545 Low back pain, unspecified: Secondary | ICD-10-CM

## 2019-03-11 MED ORDER — GABAPENTIN 300 MG PO CAPS: 300.0000 mg | ORAL_CAPSULE | Freq: Three times a day (TID) | ORAL | 2 refills | Status: DC | PRN

## 2019-03-11 NOTE — Progress Notes (Signed)
BP 108/68 (BP Location: Left Arm, Patient Position: Sitting, Cuff Size: Normal)   Pulse 91   Temp 98.5 F (36.9 C) (Oral)   Ht 5\' 1"  (1.549 m)   Wt 145 lb (65.8 kg)   SpO2 100%   BMI 27.40 kg/m    Subjective:    Patient ID: Brooke [name], female    DOB: 1996/01/22, 23 y.o.   MRN: [id number]  HPI: Brooke [name] is a 23 y.o. female  Chief Complaint  Patient presents with  . Foot Numbness/Tingling    Patient states bilateral foot numbness and tingling. Radiating to leg and ankle. Did have pain from toe to neck one time.   . Foot Pain    Patient states her feet hurt all the time.    Pins and needles, numbness of both feet radiating up to ankle one day last week. Feet have been hurting and being mildly numb prior to and since then but that was just the one day that things were numb all the way down. 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 No bowel or bladder incontinence, saddle paresthesias, fevers, chills, new injuries. Of note, she saw a podiatrist with a family member last week and had them look at her feet and they said she had b/l bunions. She would like her own referral to podiatry for management of this.   Relevant past medical, surgical, family and social history reviewed and updated as indicated. Interim medical history since our last visit reviewed. Allergies and medications reviewed and updated.  Review of Systems  Per HPI unless specifically indicated above     Objective:    BP 108/68 (BP Location: Left Arm, Patient Position: Sitting, Cuff Size: Normal)   Pulse 91   Temp 98.5 F (36.9 C) (Oral)   Ht 5\' 1"  (1.549 m)   Wt 145 lb (65.8 kg)   SpO2 100%   BMI 27.40 kg/m   Wt Readings from Last 3 Encounters:  03/11/19 145 lb (65.8 kg)  02/26/19 145 lb (65.8 kg)  12/31/18 148 lb (67.1 kg)    Physical Exam Vitals signs and nursing note reviewed.  Constitutional:      Appearance:  Normal appearance. She is not ill-appearing.  HENT:     Head: Atraumatic.  Eyes:     Extraocular Movements: Extraocular movements intact.     Conjunctiva/sclera: Conjunctivae normal.  Neck:     Musculoskeletal: Normal range of motion and neck supple.  Cardiovascular:     Rate and Rhythm: Normal rate and regular rhythm.     Pulses: Normal pulses.     Heart sounds: Normal heart sounds.  Pulmonary:     Effort: Pulmonary effort is normal.     Breath sounds: Normal breath sounds.  Abdominal:     General: Bowel sounds are normal.     Palpations: Abdomen is soft.     Tenderness: There is no abdominal tenderness. There is no guarding.  Musculoskeletal: Normal range of motion.        General: No tenderness or deformity.     Comments: - SLR b/l  Skin:    General: Skin is warm and dry.  Neurological:     Mental Status: She is alert and oriented to person, place, and time.     Sensory: No sensory deficit.     Motor: No weakness.     Gait: Gait normal.  Psychiatric:  Mood and Affect: Mood normal.        Thought Content: Thought content normal.        Judgment: Judgment normal.     Results for orders placed or performed in visit on 02/06/18  CBC with Differential/Platelet  Result Value Ref Range   WBC 6.8 3.4 - 10.8 x10E3/uL   RBC 5.00 3.77 - 5.28 x10E6/uL   Hemoglobin 14.5 11.1 - 15.9 g/dL   Hematocrit 46.9 62.9 - 46.6 %   MCV 89 79 - 97 fL   MCH 29.0 26.6 - 33.0 pg   MCHC 32.5 31.5 - 35.7 g/dL   RDW 52.8 41.3 - 24.4 %   Platelets 294 150 - 450 x10E3/uL   Neutrophils 64 Not Estab. %   Lymphs 25 Not Estab. %   Monocytes 6 Not Estab. %   Eos 5 Not Estab. %   Basos 0 Not Estab. %   Neutrophils Absolute 4.4 1.4 - 7.0 x10E3/uL   Lymphocytes Absolute 1.7 0.7 - 3.1 x10E3/uL   Monocytes Absolute 0.4 0.1 - 0.9 x10E3/uL   EOS (ABSOLUTE) 0.3 0.0 - 0.4 x10E3/uL   Basophils Absolute 0.0 0.0 - 0.2 x10E3/uL   Immature Granulocytes 0 Not Estab. %   Immature Grans (Abs) 0.0 0.0 -  0.1 x10E3/uL  TSH  Result Value Ref Range   TSH 1.470 0.450 - 4.500 uIU/mL  Rocky mtn spotted fvr abs pnl(IgG+IgM)  Result Value Ref Range   RMSF IgG Negative Negative   RMSF IgM 1.16 (H) 0.00 - 0.89 index  Lyme Ab/Western Blot Reflex  Result Value Ref Range   Lyme IgG/IgM Ab <0.91 0.00 - 0.90 ISR   LYME DISEASE AB, QUANT, IGM <0.80 0.00 - 0.79 index  Vitamin D (25 hydroxy)  Result Value Ref Range   Vit D, 25-Hydroxy 15.7 (L) 30.0 - 100.0 ng/mL  Vitamin B12  Result Value Ref Range   Vitamin B-12 448 232 - 1,245 pg/mL  HSV(herpes simplex vrs) 1+2 ab-IgG  Result Value Ref Range   HSV 1 Glycoprotein G Ab, IgG <0.91 0.00 - 0.90 index   HSV 2 IgG, Type Spec 3.19 (H) 0.00 - 0.90 index  HSV-2 IgG Supplemental Test  Result Value Ref Range   HSV-2 IgG Supplemental Test Negative Negative      Assessment & Plan:   Problem List Items Addressed This Visit    None    Visit Diagnoses    Numbness and tingling of foot    -  Primary   Will refer to podiatry and Neurosurgery, suspect sxs coming from low back but she does state she has bunions b/l she wants evaluated as well   Relevant Orders   Ambulatory referral to Podiatry   Low back pain potentially associated with radiculopathy       Referral placed to Neurosurgery, will obtain lumbar x-ray and trial gabapentin in meantime. Return precautions for worsening sxs given   Relevant Orders   Ambulatory referral to Neurosurgery   DG Lumbar Spine Complete   Need for influenza vaccination       Relevant Orders   Flu Vaccine QUAD 6+ mos PF IM (Fluarix Quad PF) (Completed)       Follow up plan: Return for as scheduled.

## 2019-03-14 ENCOUNTER — Encounter: Payer: Self-pay | Admitting: Family Medicine

## 2019-03-14 ENCOUNTER — Ambulatory Visit (INDEPENDENT_AMBULATORY_CARE_PROVIDER_SITE_OTHER): Admitting: Family Medicine

## 2019-03-14 ENCOUNTER — Other Ambulatory Visit: Payer: Self-pay

## 2019-03-14 DIAGNOSIS — K1379 Other lesions of oral mucosa: Secondary | ICD-10-CM

## 2019-03-14 DIAGNOSIS — F900 Attention-deficit hyperactivity disorder, predominantly inattentive type: Secondary | ICD-10-CM | POA: Diagnosis not present

## 2019-03-14 DIAGNOSIS — F331 Major depressive disorder, recurrent, moderate: Secondary | ICD-10-CM

## 2019-03-14 MED ORDER — QUETIAPINE FUMARATE 100 MG PO TABS: 100.0000 mg | ORAL_TABLET | Freq: Every day | ORAL | 0 refills | Status: DC

## 2019-03-14 MED ORDER — DULOXETINE HCL 60 MG PO CPEP: 60.0000 mg | ORAL_CAPSULE | Freq: Every day | ORAL | 0 refills | Status: DC

## 2019-03-14 MED ORDER — VALACYCLOVIR HCL 1 G PO TABS: 1000.0000 mg | ORAL_TABLET | Freq: Two times a day (BID) | ORAL | 0 refills | Status: AC

## 2019-03-14 NOTE — Progress Notes (Signed)
There were no vitals taken for this visit.   Subjective:    Patient ID: Brooke [name], female    DOB: Feb 11, 1996, 23 y.o.   MRN: [id number]  HPI: Brooke [name] is a 23 y.o. female  Chief Complaint  Patient presents with  . Mouth Lesions    pt states the sores just came up about 3 days ago, states she has used chapstick    . This visit was completed via WebEx due to the restrictions of the COVID-19 pandemic. All issues as above were discussed and addressed. Physical exam was done as above through visual confirmation on WebEx. If it was felt that the patient should be evaluated in the office, they were directed there. The patient verbally consented to this visit. . Location of the patient: home . Location of the provider: work . Those involved with this call:  . Provider: Roosvelt Maserachel Sagrario Lineberry, PA-C . CMA: Wilhemena DurieBrittany Russell, CMA . Front Desk/Registration: Adela Portshristan Williamson  . Time spent on call: 25 minutes with patient face to face via video conference. More than 50% of this time was spent in counseling and coordination of care. 5 minutes total spent in review of patient's record and preparation of their chart. I verified patient identity using two factors (patient name and date of birth). Patient consents verbally to being seen via telemedicine visit today.   Patient presenting today for cracking and sores in corners of mouth. These came up a few days ago and have been painful and tingly. 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 Has been using chapstick without relief.   Wanting to discuss her worsening agitation, moods. Initially felt the cymbalta was working well, now feels it may be wearing off some. Still also taking the seroquel at bedtime which seems to be working very well. Under a lot of stress at this point in her own life. Denies SI/HI but notes she gets very angry very quickly, particularly with her fiance. Was recently started on strattera which she  does feel is working for focus.   [facility] 02/26/2019 12/31/2018 12/14/2018  Decreased Interest 2 2 2   Down, Depressed, Hopeless 1 1 1   PHQ - 2 Score 3 3 3   Altered sleeping 0 2 3  Tired, decreased energy 2 2 2   Change in appetite 1 2 1   Feeling bad or failure about yourself  2 1 1   Trouble concentrating 1 0 1  Moving slowly or fidgety/restless 0 0 0  Suicidal thoughts 0 0 0  PHQ-9 Score 9 10 11   Difficult doing work/chores Not difficult at all Somewhat difficult Somewhat difficult   GAD 7 : Generalized Anxiety Score 03/20/2018 02/06/2018  Nervous, Anxious, on Edge 0 1  Control/stop worrying 1 1  Worry too much - different things 1 1  Trouble relaxing 1 1  Restless 0 0  Easily annoyed or irritable 1 1  Afraid - awful might happen 0 0  Total GAD 7 Score 4 5   Relevant past medical, surgical, family and social history reviewed and updated as indicated. Interim medical history since our last visit reviewed. Allergies and medications reviewed and updated.  Review of Systems  Per HPI unless specifically indicated above     Objective:    There were no vitals taken for this visit.  Wt Readings from Last 3 Encounters:  03/11/19 145 lb (65.8 kg)  02/26/19 145 lb (65.8 kg)  12/31/18 148 lb (67.1 kg)  Physical Exam Vitals signs and nursing note reviewed.  Constitutional:      General: She is not in acute distress.    Appearance: Normal appearance.  HENT:     Head: Atraumatic.     Right Ear: External ear normal.     Left Ear: External ear normal.     Nose: Nose normal. No congestion.     Mouth/Throat:     Mouth: Mucous membranes are moist.     Pharynx: Oropharynx is clear. No posterior oropharyngeal erythema.  Eyes:     Extraocular Movements: Extraocular movements intact.     Conjunctiva/sclera: Conjunctivae normal.  Neck:     Musculoskeletal: Normal range of motion.  Cardiovascular:     Comments: Unable to assess via virtual visit Pulmonary:      Effort: Pulmonary effort is normal. No respiratory distress.  Musculoskeletal: Normal range of motion.  Skin:    General: Skin is dry.     Findings: No erythema.     Comments: Cracking and sores on b/l corners of mouth  Neurological:     Mental Status: She is alert and oriented to person, place, and time.  Psychiatric:        Mood and Affect: Mood normal.        Thought Content: Thought content normal.        Judgment: Judgment normal.     Results for orders placed or performed in visit on 02/06/18  CBC with Differential/Platelet  Result Value Ref Range   WBC 6.8 3.4 - 10.8 x10E3/uL   RBC 5.00 3.77 - 5.28 x10E6/uL   Hemoglobin 14.5 11.1 - 15.9 g/dL   Hematocrit 44.6 34.0 - 46.6 %   MCV 89 79 - 97 fL   MCH 29.0 26.6 - 33.0 pg   MCHC 32.5 31.5 - 35.7 g/dL   RDW 13.0 12.3 - 15.4 %   Platelets 294 150 - 450 x10E3/uL   Neutrophils 64 Not Estab. %   Lymphs 25 Not Estab. %   Monocytes 6 Not Estab. %   Eos 5 Not Estab. %   Basos 0 Not Estab. %   Neutrophils Absolute 4.4 1.4 - 7.0 x10E3/uL   Lymphocytes Absolute 1.7 0.7 - 3.1 x10E3/uL   Monocytes Absolute 0.4 0.1 - 0.9 x10E3/uL   EOS (ABSOLUTE) 0.3 0.0 - 0.4 x10E3/uL   Basophils Absolute 0.0 0.0 - 0.2 x10E3/uL   Immature Granulocytes 0 Not Estab. %   Immature Grans (Abs) 0.0 0.0 - 0.1 x10E3/uL  TSH  Result Value Ref Range   TSH 1.470 0.450 - 4.500 uIU/mL  Rocky mtn spotted fvr abs pnl(IgG+IgM)  Result Value Ref Range   RMSF IgG Negative Negative   RMSF IgM 1.16 (H) 0.00 - 0.89 index  Lyme Ab/Western Blot Reflex  Result Value Ref Range   Lyme IgG/IgM Ab <0.91 0.00 - 0.90 ISR   LYME DISEASE AB, QUANT, IGM <0.80 0.00 - 0.79 index  Vitamin D (25 hydroxy)  Result Value Ref Range   Vit D, 25-Hydroxy 15.7 (L) 30.0 - 100.0 ng/mL  Vitamin B12  Result Value Ref Range   Vitamin B-12 448 232 - 1,245 pg/mL  HSV(herpes simplex vrs) 1+2 ab-IgG  Result Value Ref Range   HSV 1 Glycoprotein G Ab, IgG <0.91 0.00 - 0.90 index   HSV 2  IgG, Type Spec 3.19 (H) 0.00 - 0.90 index  HSV-2 IgG Supplemental Test  Result Value Ref Range   HSV-2 IgG Supplemental Test Negative Negative  Assessment & Plan:   Problem List Items Addressed This Visit      Other   Depression    With increased agitation. Increase cymbalta, monitor for benefit. May need to increase seroquel if not improving       Relevant Medications   DULoxetine (CYMBALTA) 60 MG capsule   ADHD    So far feels benefit from the strattera, continue current regimen.        Other Visit Diagnoses    Mouth sore    -  Primary   Some chelitis noted on exam, but pt concerned about herpes sores. Will use neosporin or vaseline on corners, valtrex prn to see if benefit       Follow up plan: Return in about 4 weeks (around 04/11/2019) for Mood, focus f/u .

## 2019-03-19 NOTE — Assessment & Plan Note (Signed)
With increased agitation. Increase cymbalta, monitor for benefit. May need to increase seroquel if not improving

## 2019-03-19 NOTE — Assessment & Plan Note (Signed)
So far feels benefit from the strattera, continue current regimen.

## 2019-04-02 DIAGNOSIS — M5441 Lumbago with sciatica, right side: Secondary | ICD-10-CM | POA: Diagnosis not present

## 2019-04-02 DIAGNOSIS — G8929 Other chronic pain: Secondary | ICD-10-CM | POA: Diagnosis not present

## 2019-04-02 DIAGNOSIS — M5442 Lumbago with sciatica, left side: Secondary | ICD-10-CM | POA: Diagnosis not present

## 2019-04-02 DIAGNOSIS — M5416 Radiculopathy, lumbar region: Secondary | ICD-10-CM | POA: Diagnosis not present

## 2019-04-03 ENCOUNTER — Other Ambulatory Visit: Payer: Self-pay | Admitting: Student

## 2019-04-03 ENCOUNTER — Other Ambulatory Visit (HOSPITAL_COMMUNITY): Payer: Self-pay | Admitting: Student

## 2019-04-03 DIAGNOSIS — G8929 Other chronic pain: Secondary | ICD-10-CM

## 2019-04-03 DIAGNOSIS — M5442 Lumbago with sciatica, left side: Secondary | ICD-10-CM

## 2019-04-09 ENCOUNTER — Other Ambulatory Visit: Payer: Self-pay

## 2019-04-09 MED ORDER — ATOMOXETINE HCL 60 MG PO CAPS: 60.0000 mg | ORAL_CAPSULE | Freq: Every day | ORAL | 0 refills | Status: DC

## 2019-04-22 ENCOUNTER — Ambulatory Visit

## 2019-04-30 ENCOUNTER — Other Ambulatory Visit: Payer: Self-pay

## 2019-04-30 ENCOUNTER — Ambulatory Visit
Admission: RE | Admit: 2019-04-30 | Discharge: 2019-04-30 | Disposition: A | Discharge status: Home / Self Care | Source: Ambulatory Visit | Attending: Student | Admitting: Student

## 2019-04-30 DIAGNOSIS — M5441 Lumbago with sciatica, right side: Secondary | ICD-10-CM | POA: Insufficient documentation

## 2019-04-30 DIAGNOSIS — M5442 Lumbago with sciatica, left side: Secondary | ICD-10-CM | POA: Diagnosis present

## 2019-04-30 DIAGNOSIS — G8929 Other chronic pain: Secondary | ICD-10-CM | POA: Diagnosis present

## 2019-07-01 ENCOUNTER — Ambulatory Visit: Attending: Family

## 2019-07-01 ENCOUNTER — Other Ambulatory Visit: Payer: Self-pay

## 2019-07-01 DIAGNOSIS — Z20822 Contact with and (suspected) exposure to covid-19: Secondary | ICD-10-CM

## 2019-07-02 LAB — NOVEL CORONAVIRUS, NAA: SARS-CoV-2, NAA: NOT DETECTED

## 2019-07-08 ENCOUNTER — Encounter: Payer: Self-pay | Admitting: Nurse Practitioner

## 2019-07-08 ENCOUNTER — Other Ambulatory Visit: Payer: Self-pay

## 2019-07-08 ENCOUNTER — Ambulatory Visit (INDEPENDENT_AMBULATORY_CARE_PROVIDER_SITE_OTHER): Admitting: Nurse Practitioner

## 2019-07-08 VITALS — HR 96 | Ht 61.75 in | Wt 135.0 lb

## 2019-07-08 DIAGNOSIS — B349 Viral infection, unspecified: Secondary | ICD-10-CM

## 2019-07-08 MED ORDER — BENZONATATE 100 MG PO CAPS: 100.0000 mg | ORAL_CAPSULE | Freq: Two times a day (BID) | ORAL | 0 refills | Status: DC | PRN

## 2019-07-08 MED ORDER — PROMETHAZINE HCL 25 MG PO TABS: 25.0000 mg | ORAL_TABLET | Freq: Three times a day (TID) | ORAL | 0 refills | Status: DC | PRN

## 2019-07-08 MED ORDER — ONDANSETRON 4 MG PO TBDP: 4.0000 mg | ORAL_TABLET | Freq: Three times a day (TID) | ORAL | 0 refills | Status: DC | PRN

## 2019-07-08 NOTE — Progress Notes (Addendum)
Pulse 96   Ht 5' 1.75" (1.568 m)   Wt 135 lb (61.2 kg)   BMI 24.89 kg/m    Subjective:    Patient ID: Brooke [name], female    DOB: [birth year], 24 y.o.   MRN: [id number]  HPI: Brooke [name] is a 24 y.o. female  Chief Complaint  Patient presents with  . Nausea    Ongoing 1 week  . Emesis  . Diarrhea  . Headache  . Nasal Congestion   UPPER RESPIRATORY TRACT INFECTION Symptom onset: 1 week or longer Worst symptom: nausea Fever: no, have been waking up in sweat Cough: yes, dry hack Shortness of breath: no Wheezing: no Chest pain: no Chest tightness: no Chest congestion: no Nasal congestion: yes Runny nose: yes Post nasal drip: yes Sneezing: no Sore throat: no Swollen glands: yes Sinus pressure: yes Headache: yes, neck to back of head and towards front - similar to how migraines feel Face pain: no Toothache: yes; pain in jaw Ear pain: no  Ear pressure: no  Eyes red/itching:no Eye drainage/crusting: no  Nausea: yes Diarrhea: yes - x 2-3/day Vomiting: yes, x2-3 Rash: no Fatigue: yes Sick contacts: no Strep contacts: no  Context: worse Recurrent sinusitis: no Relief with OTC cold/cough medications: mild relief Treatments attempted: Tylenol/motrin   Reports being able to eat and keep fluids down without issue.  Allergies  Allergen Reactions  . Sulfa Antibiotics   . Wellbutrin [Bupropion] Anxiety   Outpatient Encounter Medications as of 07/08/2019  Medication Sig  . acetaminophen (TYLENOL) 325 MG tablet Take by mouth.  Marland Kitchen atomoxetine (STRATTERA) 60 MG capsule Take 1 capsule (60 mg total) by mouth daily.  . DULoxetine (CYMBALTA) 60 MG capsule Take 1 capsule (60 mg total) by mouth daily.  Marland Kitchen gabapentin (NEURONTIN) 300 MG capsule Take 1 capsule (300 mg total) by mouth 3 (three) times daily as needed.  . naproxen (NAPROSYN) 500 MG tablet Take by mouth.  . ondansetron (ZOFRAN-ODT) 4 MG disintegrating tablet Take 1 tablet (4 mg total) by mouth every 8 (eight)  hours as needed for nausea or vomiting.  . promethazine (PHENERGAN) 25 MG tablet Take 1 tablet (25 mg total) by mouth every 8 (eight) hours as needed for nausea or vomiting. If zofran does not work  . QUEtiapine (SEROQUEL) 100 MG tablet Take 1 tablet (100 mg total) by mouth at bedtime.  . rizatriptan (MAXALT) 10 MG tablet Take 1 tab at first sign of migraine. May repeat in 2 hours if needed  . topiramate (TOPAMAX) 50 MG tablet Take 1 tablet (50 mg total) by mouth 2 (two) times daily.  . valACYclovir (VALTREX) 1000 MG tablet Take 1 tablet (1,000 mg total) by mouth 2 (two) times daily.  . Vitamin D, Ergocalciferol, (DRISDOL) 50000 units CAPS capsule Take 1 capsule (50,000 Units total) by mouth every 7 (seven) days.  . [DISCONTINUED] ondansetron (ZOFRAN-ODT) 4 MG disintegrating tablet Take 1 tablet (4 mg total) by mouth every 8 (eight) hours as needed for nausea or vomiting.  . [DISCONTINUED] promethazine (PHENERGAN) 25 MG tablet Take 1 tablet (25 mg total) by mouth every 8 (eight) hours as needed for nausea or vomiting.  . benzonatate (TESSALON) 100 MG capsule Take 1 capsule (100 mg total) by mouth 2 (two) times daily as needed for cough.   No facility-administered encounter medications on file as of 07/08/2019.   Patient Active Problem List   Diagnosis Date Noted  . Viral illness 07/08/2019  . ADHD 03/03/2019  . Sore throat 01/28/2019  .  Insomnia 12/20/2018  . Vitamin D deficiency 03/20/2018  . Migraine 02/09/2018  . Depression 02/09/2018  . 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 Anxiety   . Concussion   . Depression   . Dyschromia   . Head ache   . Migraine    Relevant past medical, surgical, family and social history reviewed and updated as indicated. Interim medical history since our last visit reviewed.  Review of Systems  Constitutional: Positive for chills and fatigue. Negative for fever.  HENT: Positive for congestion,  postnasal drip, rhinorrhea and sinus pressure. Negative for dental problem, ear discharge, ear pain, facial swelling, sinus pain, sneezing, sore throat and trouble swallowing.   Eyes: Negative.  Negative for pain, discharge, redness and itching.  Respiratory: Positive for cough. Negative for chest tightness, shortness of breath and wheezing.   Cardiovascular: Negative.  Negative for chest pain.  Gastrointestinal: Positive for diarrhea, nausea and vomiting. Negative for abdominal pain and blood in stool.  Skin: Negative.  Negative for rash.  Neurological: Positive for headaches. Negative for dizziness, weakness and light-headedness.  Hematological: Negative.  Negative for adenopathy.  Psychiatric/Behavioral: Negative.  Negative for agitation and sleep disturbance. The patient is not nervous/anxious.     Per HPI unless specifically indicated above     Objective:    Pulse 96   Ht 5' 1.75" (1.568 m)   Wt 135 lb (61.2 kg)   BMI 24.89 kg/m   Wt Readings from Last 3 Encounters:  07/08/19 135 lb (61.2 kg)  03/11/19 145 lb (65.8 kg)  02/26/19 145 lb (65.8 kg)    Physical Exam Nursing note reviewed.  Constitutional:      General: She is not in acute distress.    Appearance: Normal appearance. She is well-developed. She is not ill-appearing, toxic-appearing or diaphoretic.  HENT:     Head: Normocephalic.     Right Ear: External ear normal.     Left Ear: External ear normal.     Nose: Congestion and rhinorrhea present.     Mouth/Throat:     Mouth: Mucous membranes are moist.     Pharynx: Oropharynx is clear.  Eyes:     General: No scleral icterus.       Right eye: No discharge.        Left eye: No discharge.     Extraocular Movements: Extraocular movements intact.     Conjunctiva/sclera: Conjunctivae normal.  Pulmonary:     Effort: Pulmonary effort is normal. No respiratory distress.  Musculoskeletal:        General: Normal range of motion.     Cervical back: Normal range of  motion. No rigidity.  Skin:    Coloration: Skin is not jaundiced or pale.     Findings: No erythema.  Neurological:     General: No focal deficit present.     Mental Status: She is alert and oriented to person, place, and time.     Motor: No weakness.  Psychiatric:        Mood and Affect: Mood normal.        Behavior: Behavior normal.        Thought Content: Thought content normal.        Judgment: Judgment normal.     Results for orders placed or performed in visit on 07/01/19  Novel Coronavirus, NAA ([facility])   Specimen: Nasopharyngeal(NP) swabs in vial transport medium   NASOPHARYNGE  TESTING  Result Value Ref Range  SARS-CoV-2, NAA Not Detected Not Detected      Assessment & Plan:   Problem List Items Addressed This Visit      Other   Viral illness - Primary    Acute, ongoing.  Symptoms consistent with viral infection.  COVID-19 test negative when symptoms started. Will send in prescriptions for zofran, phenergan, and tessalon.  Advised to continue to hydrate, rest, and manage symptoms.  Work note sent to [employer] for patient.  Return to clinic if symptoms persist >14 days or with any new concerns.  Go to ED if unable to keep fluids down.        Relevant Medications   benzonatate (TESSALON) 100 MG capsule   promethazine (PHENERGAN) 25 MG tablet   ondansetron (ZOFRAN-ODT) 4 MG disintegrating tablet       Follow up plan: Return if symptoms worsen or fail to improve.  Due to the catastrophic nature of the COVID-19 pandemic, this visit was done through audio contact only.","This visit was completed via Doximity due to the restrictions of the COVID-19 pandemic. All issues as above were discussed and addressed. Physical exam was done as above through visual confirmation on Doximity. If it was felt that the patient should be evaluated in the office, they were directed there. The patient verbally consented to this visit."} . Location of the patient: home . Location of the  provider: work . Those involved with this call:  . Provider: Mardene Celeste, DNP . CMA: Myrtha Mantis, CMA . Front Desk/Registration: Adela Ports  . Time spent on call: 16 minutes on the phone discussing health concerns. 20 minutes total spent in review of patient's record and preparation of their chart.

## 2019-07-08 NOTE — Assessment & Plan Note (Addendum)
Acute, ongoing.  Symptoms consistent with viral infection.  COVID-19 test negative when symptoms started. Will send in prescriptions for zofran, phenergan, and tessalon.  Advised to continue to hydrate, rest, and manage symptoms.  Work note sent to [employer] for patient.  Return to clinic if symptoms persist >14 days or with any new concerns.  Go to ED if unable to keep fluids down.

## 2019-07-09 ENCOUNTER — Other Ambulatory Visit: Payer: Self-pay | Admitting: Family Medicine

## 2019-07-09 NOTE — Telephone Encounter (Signed)
Routing to provider  

## 2019-07-13 ENCOUNTER — Other Ambulatory Visit: Payer: Self-pay | Admitting: Family Medicine

## 2019-07-13 NOTE — Telephone Encounter (Signed)
Requested medication (s) are due for refill today: yes  Requested medication (s) are on the active medication list: yes  Last refill:  04/09/19  Future visit scheduled: no  Notes to clinic:  medication not delegated to NT to refill- pt needs OV   Requested Prescriptions  Pending Prescriptions Disp Refills   atomoxetine (STRATTERA) 60 MG capsule [Pharmacy Med Name: ATOMOXETINE 60MG  CAPSULES] 30 capsule 0    Sig: TAKE 1 CAPSULE(60 MG) BY MOUTH DAILY      Not Delegated - Psychiatry: Norepinephrine Reuptake Inhibitor Failed - 07/13/2019  5:38 PM      Failed - This refill cannot be delegated      Passed - Valid encounter within last 6 months    Recent Outpatient Visits           5 days ago Viral illness   [facility] [place], [place]   4 months ago Mouth sore   [facility] [place], [place]   4 months ago Numbness and tingling of foot   [facility] [place], [place], [place]   4 months ago Migraine without status migrainosus, not intractable, unspecified migraine type   [facility], [place], [place]   5 months ago Sore throat   [facility] [place], [place], [place]

## 2019-07-15 NOTE — Telephone Encounter (Signed)
Last follow up 03/14/19

## 2019-08-07 ENCOUNTER — Ambulatory Visit: Attending: Internal Medicine

## 2019-08-07 DIAGNOSIS — Z20822 Contact with and (suspected) exposure to covid-19: Secondary | ICD-10-CM

## 2019-08-08 LAB — NOVEL CORONAVIRUS, NAA: SARS-CoV-2, NAA: NOT DETECTED

## 2019-08-16 ENCOUNTER — Ambulatory Visit: Attending: Internal Medicine

## 2019-08-16 DIAGNOSIS — Z20822 Contact with and (suspected) exposure to covid-19: Secondary | ICD-10-CM

## 2019-08-17 LAB — NOVEL CORONAVIRUS, NAA: SARS-CoV-2, NAA: NOT DETECTED

## 2019-09-10 ENCOUNTER — Ambulatory Visit (INDEPENDENT_AMBULATORY_CARE_PROVIDER_SITE_OTHER): Admitting: Family Medicine

## 2019-09-10 ENCOUNTER — Encounter: Payer: Self-pay | Admitting: Family Medicine

## 2019-09-10 VITALS — HR 94 | Temp 98.6°F | Wt 147.0 lb

## 2019-09-10 DIAGNOSIS — R5383 Other fatigue: Secondary | ICD-10-CM | POA: Diagnosis not present

## 2019-09-10 DIAGNOSIS — R110 Nausea: Secondary | ICD-10-CM | POA: Diagnosis not present

## 2019-09-10 DIAGNOSIS — F900 Attention-deficit hyperactivity disorder, predominantly inattentive type: Secondary | ICD-10-CM

## 2019-09-10 DIAGNOSIS — G43909 Migraine, unspecified, not intractable, without status migrainosus: Secondary | ICD-10-CM | POA: Diagnosis not present

## 2019-09-10 DIAGNOSIS — Z205 Contact with and (suspected) exposure to viral hepatitis: Secondary | ICD-10-CM

## 2019-09-10 DIAGNOSIS — G4700 Insomnia, unspecified: Secondary | ICD-10-CM

## 2019-09-10 DIAGNOSIS — F331 Major depressive disorder, recurrent, moderate: Secondary | ICD-10-CM

## 2019-09-10 MED ORDER — GABAPENTIN 300 MG PO CAPS: 300.0000 mg | ORAL_CAPSULE | Freq: Three times a day (TID) | ORAL | 2 refills | Status: DC | PRN

## 2019-09-10 MED ORDER — AIMOVIG 70 MG/ML ~~LOC~~ SOAJ: 70.0000 mg | SUBCUTANEOUS | 1 refills | Status: DC

## 2019-09-10 MED ORDER — QUETIAPINE FUMARATE 100 MG PO TABS: ORAL_TABLET | ORAL | 0 refills | Status: DC

## 2019-09-10 MED ORDER — TOPIRAMATE 50 MG PO TABS: 50.0000 mg | ORAL_TABLET | Freq: Two times a day (BID) | ORAL | 2 refills | Status: DC

## 2019-09-10 MED ORDER — ATOMOXETINE HCL 60 MG PO CAPS: ORAL_CAPSULE | ORAL | 0 refills | Status: DC

## 2019-09-10 MED ORDER — DULOXETINE HCL 60 MG PO CPEP: 60.0000 mg | ORAL_CAPSULE | Freq: Every day | ORAL | 0 refills | Status: DC

## 2019-09-10 MED ORDER — ONDANSETRON 4 MG PO TBDP: 4.0000 mg | ORAL_TABLET | Freq: Three times a day (TID) | ORAL | 0 refills | Status: DC | PRN

## 2019-09-10 MED ORDER — RIZATRIPTAN BENZOATE 10 MG PO TABS: ORAL_TABLET | ORAL | 6 refills | Status: DC

## 2019-09-10 NOTE — Progress Notes (Signed)
Pulse 94   Temp 98.6 F (37 C) (Oral)   Wt 147 lb (66.7 kg)   BMI 27.10 kg/m    Subjective:    Patient ID: Brooke [name], female    DOB: [birth year], 24 y.o.   MRN: [id number]  HPI: Brooke [name] is a 24 y.o. female  Chief Complaint  Patient presents with  . Depression    discuss medication  . Headache    symptoms started a week ago  . Nausea  . Diarrhea    . This visit was completed via MyChart due to the restrictions of the COVID-19 pandemic. All issues as above were discussed and addressed. Physical exam was done as above through visual confirmation on MyChart. If it was felt that the patient should be evaluated in the office, they were directed there. The patient verbally consented to this visit. . Location of the patient: home . Location of the provider: home . Those involved with this call:  . Provider: Merrie Roof, PA-C . CMA: Lesle Chris, [place] . Front Desk/Registration: Jill Side  . Time spent on call: 30 minutes with patient face to face via video conference. More than 50% of this time was spent in counseling and coordination of care. 5 minutes total spent in review of patient's record and preparation of their chart. I verified patient identity using two factors (patient name and date of birth). Patient consents verbally to being seen via telemedicine visit today.   Has been out of all of her medicines for about 2 months now due to insurance issues. Wanting to restart regimen. Was doing well prior to this happening.   Still having significant migraines, wanting to try the injectable migraine medication at this point. Hx of concussions which exacerbate these, followed by a concussion specialist. Still taking topamax and cymbalta for these as well. Has migraines several days per week. Has maxalt prn.   Fatigue, body aches, headaches, stomach pains, nausea and diarrhea, some shortness of breath the past 5 weeks or so since roommates were positive for COVID. Has  been tested twice and negative both times. Has a repeat test coming up on Thursday. Of note, has been off cymbalta for this time frame.   Mother was tested positive for hepatitis c recently, patient asymptomatic but wanting to be screened due to close proximity.   Depression - was doing well on cymbalta and seroquel regimen prior to running out of insurance. Wanting to restart these.   ADHD - benefiting from the strattera prior to running out, wanting to restart. Denies side effects.   Depression screen [facility] 2/9 09/10/2019 02/26/2019 12/31/2018  Decreased Interest 1 2 2   Down, Depressed, Hopeless 1 1 1   PHQ - 2 Score 2 3 3   Altered sleeping 1 0 2  Tired, decreased energy 3 2 2   Change in appetite 1 1 2   Feeling bad or failure about yourself  0 2 1  Trouble concentrating 1 1 0  Moving slowly or fidgety/restless 0 0 0  Suicidal thoughts 0 0 0  PHQ-9 Score 8 9 10   Difficult doing work/chores Somewhat difficult Not difficult at all Somewhat difficult   GAD 7 : Generalized Anxiety Score 03/20/2018 02/06/2018  Nervous, Anxious, on Edge 0 1  Control/stop worrying 1 1  Worry too much - different things 1 1  Trouble relaxing 1 1  Restless 0 0  Easily annoyed or irritable 1 1  Afraid - awful might happen 0 0  Total GAD 7 Score 4 5  Relevant past medical, surgical, family and social history reviewed and updated as indicated. Interim medical history since our last visit reviewed. Allergies and medications reviewed and updated.  Review of Systems  Per HPI unless specifically indicated above     Objective:    Pulse 94   Temp 98.6 F (37 C) (Oral)   Wt 147 lb (66.7 kg)   BMI 27.10 kg/m   Wt Readings from Last 3 Encounters:  09/10/19 147 lb (66.7 kg)  07/08/19 135 lb (61.2 kg)  03/11/19 145 lb (65.8 kg)    Physical Exam Vitals and nursing note reviewed.  Constitutional:      General: She is not in acute distress.    Appearance: Normal appearance.  HENT:     Head: Atraumatic.      Right Ear: External ear normal.     Left Ear: External ear normal.     Nose: Nose normal. No congestion.     Mouth/Throat:     Mouth: Mucous membranes are moist.     Pharynx: Oropharynx is clear. No posterior oropharyngeal erythema.  Eyes:     Extraocular Movements: Extraocular movements intact.     Conjunctiva/sclera: Conjunctivae normal.  Cardiovascular:     Comments: Unable to assess via virtual visit Pulmonary:     Effort: Pulmonary effort is normal. No respiratory distress.  Musculoskeletal:        General: Normal range of motion.     Cervical back: Normal range of motion.  Skin:    General: Skin is dry.     Findings: No erythema.  Neurological:     Mental Status: She is alert and oriented to person, place, and time.  Psychiatric:        Mood and Affect: Mood normal.        Thought Content: Thought content normal.        Judgment: Judgment normal.     Results for orders placed or performed in visit on 08/16/19  Novel Coronavirus, NAA ([facility])   Specimen: Nasopharyngeal(NP) swabs in vial transport medium   NASOPHARYNGE  TESTING  Result Value Ref Range   SARS-CoV-2, NAA Not Detected Not Detected      Assessment & Plan:   Problem List Items Addressed This Visit      Cardiovascular and Mediastinum   Migraine - Primary    Continue current regimen, add aimovig given consistent poor control with her migraines.       Relevant Medications   topiramate (TOPAMAX) 50 MG tablet   rizatriptan (MAXALT) 10 MG tablet   gabapentin (NEURONTIN) 300 MG capsule   DULoxetine (CYMBALTA) 60 MG capsule   Erenumab-aooe (AIMOVIG) 70 MG/ML SOAJ     Other   Depression    Restart previous regimen, continue to monitor for benefit      Relevant Medications   DULoxetine (CYMBALTA) 60 MG capsule   Insomnia    Restart seroquel, continue to monitor      ADHD    Restart strattera as this seemed helpful      Viral illness    With ongoing sxs, unsure if related to exposures to COVID  virus or not. WIll run basic labs for rule out and continue to monitor closely. Hoping getting back on cymbalta will help with some of these sxs      Relevant Medications   ondansetron (ZOFRAN-ODT) 4 MG disintegrating tablet    Other Visit Diagnoses    Fatigue, unspecified type       Await labs for  rule out, restart medications, continue to monitor   Relevant Orders   CBC with Differential/Platelet   TSH   VITAMIN D 25 Hydroxy (Vit-D Deficiency, Fractures)   Vitamin B12   Exposure to hepatitis C       Await screening results due to mother testing positive   Relevant Orders   Hepatitis C antibody      40 minutes spent today in direct care and counseling with patient  Follow up plan: Return in about 4 weeks (around 10/08/2019) for Migraine, ADHD f/u.

## 2019-09-12 ENCOUNTER — Ambulatory Visit: Attending: Internal Medicine

## 2019-09-12 DIAGNOSIS — Z20822 Contact with and (suspected) exposure to covid-19: Secondary | ICD-10-CM

## 2019-09-13 ENCOUNTER — Telehealth: Payer: Self-pay

## 2019-09-13 LAB — SARS-COV-2, NAA 2 DAY TAT

## 2019-09-13 LAB — NOVEL CORONAVIRUS, NAA: SARS-CoV-2, NAA: NOT DETECTED

## 2019-09-13 NOTE — Telephone Encounter (Signed)
Prior Authorization initiated via CoverMyMeds for Aimovig 70MG /ML auto-injectors Key: [id number]  Will send PA once progress note is completed.

## 2019-09-18 NOTE — Telephone Encounter (Signed)
Routing to provider  

## 2019-09-19 NOTE — Assessment & Plan Note (Signed)
Restart previous regimen, continue to monitor for benefit

## 2019-09-19 NOTE — Assessment & Plan Note (Signed)
Continue current regimen, add aimovig given consistent poor control with her migraines.

## 2019-09-19 NOTE — Telephone Encounter (Signed)
OV note signed.

## 2019-09-19 NOTE — Assessment & Plan Note (Signed)
Restart seroquel, continue to monitor

## 2019-09-19 NOTE — Assessment & Plan Note (Signed)
Restart strattera as this seemed helpful

## 2019-09-19 NOTE — Telephone Encounter (Signed)
PA Approved

## 2019-09-19 NOTE — Assessment & Plan Note (Signed)
With ongoing sxs, unsure if related to exposures to COVID virus or not. WIll run basic labs for rule out and continue to monitor closely. Hoping getting back on cymbalta will help with some of these sxs

## 2019-09-19 NOTE — Telephone Encounter (Signed)
PA sent to plan

## 2019-09-25 ENCOUNTER — Encounter: Payer: Self-pay | Admitting: Family Medicine

## 2019-10-07 ENCOUNTER — Encounter: Payer: Self-pay | Admitting: Family Medicine

## 2019-10-10 ENCOUNTER — Telehealth: Admitting: Family Medicine

## 2019-10-15 ENCOUNTER — Telehealth: Admitting: Family Medicine

## 2019-10-17 ENCOUNTER — Other Ambulatory Visit: Payer: Self-pay

## 2019-10-17 ENCOUNTER — Encounter: Payer: Self-pay | Admitting: Nurse Practitioner

## 2019-10-17 ENCOUNTER — Ambulatory Visit (INDEPENDENT_AMBULATORY_CARE_PROVIDER_SITE_OTHER): Payer: Commercial Managed Care - PPO | Admitting: Nurse Practitioner

## 2019-10-17 VITALS — BP 122/63 | HR 98 | Temp 98.1°F | Ht 61.0 in | Wt 144.0 lb

## 2019-10-17 DIAGNOSIS — M546 Pain in thoracic spine: Secondary | ICD-10-CM | POA: Diagnosis not present

## 2019-10-17 MED ORDER — CYCLOBENZAPRINE HCL 5 MG PO TABS: 5.0000 mg | ORAL_TABLET | Freq: Three times a day (TID) | ORAL | 0 refills | Status: DC | PRN

## 2019-10-17 MED ORDER — PREDNISONE 20 MG PO TABS: 40.0000 mg | ORAL_TABLET | Freq: Every day | ORAL | 0 refills | Status: DC

## 2019-10-17 NOTE — Patient Instructions (Signed)

## 2019-10-17 NOTE — Assessment & Plan Note (Addendum)
Acute, ongoing.  No red flags on history or with examination.  Given history of soccer injuries with similar symptoms, likely muscle strain.  Will treat conservatively with muscle relaxant and oral steroid to decrease inflammation.  Back exercise handout given.  If not better next week, referral to PT may be indicated, this was discussed with the patient.  May consider imaging if pain/symptoms not improving in 2-6 weeks.  Follow closely, return in 1 week to reassess symptoms and follow up in migraines with pcp.  With any sudden increase in pain, paralysis of extremities, or incontinence or urine or stool, go to ED.  Note given for work.

## 2019-10-17 NOTE — Progress Notes (Signed)
BP 122/63 (BP Location: Left Arm, Patient Position: Sitting, Cuff Size: Normal)   Pulse 98   Temp 98.1 F (36.7 C) (Oral)   Ht 5\' 1"  (1.549 m)   Wt 144 lb (65.3 kg)   SpO2 92%   BMI 27.21 kg/m    Subjective:    Patient ID: Brooke [name], female    DOB: 09/14/1995, 24 y.o.   MRN: [id number]  HPI: Brooke [name] is a 24 y.o. female presenting with back pain.  Chief Complaint  Patient presents with  . Back Pain    Patient fell over her pets into her bed post. Patient states right leg is numb, neck pain, low and upper back pain. Patient having difficulty working.    BACK PAIN Patient fell two days ago during the night onto her bed post.  After the fall, patient's fiance had to help move her back onto the bed.  She now has to sleep on her back and used to be able to sleep on her side.  Patient reports she has had numbness of the same leg in the past when she played soccer, about 3-4 years ago. She states she pinched her sciatic nerve then.  Patient reports she has been dizzy and nauseous since this happened.  She does not remember if she hit her head and does have a history of concussions. Duration: days Mechanism of injury: fall onto bed post Location: upper mid spine Onset: sudden Severity: severe Quality: shooting Frequency: constant, worse with activity Radiation: up neck Aggravating factors: sleeping on side, sitting, standing, driving, lifting,  Alleviating factors: laying down,  gabapentin with some relief, Tylenol, motrin with no relief Status: stable Treatments attempted: Tylenol, motrin, gabapentin   Relief with NSAIDs?: no Nighttime pain:  no Paresthesias / decreased sensation:  yes; decreased sensation to right leg; has improved Bowel / bladder incontinence:  no Fevers:  no Dysuria / urinary frequency:  no  Allergies  Allergen Reactions  . Sulfa Antibiotics   . Wellbutrin [Bupropion] Anxiety   Outpatient Encounter Medications as of 10/17/2019  Medication Sig   . acetaminophen (TYLENOL) 325 MG tablet Take by mouth as needed.   10/19/2019 atomoxetine (STRATTERA) 60 MG capsule TAKE 1 CAPSULE(60 MG) BY MOUTH DAILY  . DULoxetine (CYMBALTA) 60 MG capsule Take 1 capsule (60 mg total) by mouth daily.  Marland Kitchen (AIMOVIG) 70 MG/ML SOAJ Inject 70 mg into the skin every 30 (thirty) days.  Dorise Hiss gabapentin (NEURONTIN) 300 MG capsule Take 1 capsule (300 mg total) by mouth 3 (three) times daily as needed.  . naproxen (NAPROSYN) 500 MG tablet Take by mouth.  . ondansetron (ZOFRAN-ODT) 4 MG disintegrating tablet Take 1 tablet (4 mg total) by mouth every 8 (eight) hours as needed for nausea or vomiting.  . promethazine (PHENERGAN) 25 MG tablet Take 1 tablet (25 mg total) by mouth every 8 (eight) hours as needed for nausea or vomiting. If zofran does not work  . QUEtiapine (SEROQUEL) 100 MG tablet TAKE 1 TABLET(100 MG) BY MOUTH AT BEDTIME  . rizatriptan (MAXALT) 10 MG tablet Take 1 tab at first sign of migraine. May repeat in 2 hours if needed  . topiramate (TOPAMAX) 50 MG tablet Take 1 tablet (50 mg total) by mouth 2 (two) times daily.  . valACYclovir (VALTREX) 1000 MG tablet Take 1 tablet (1,000 mg total) by mouth 2 (two) times daily.  . Vitamin D, Ergocalciferol, (DRISDOL) 50000 units CAPS capsule Take 1 capsule (50,000 Units total) by mouth every 7 (seven)  days.  . cyclobenzaprine (FLEXERIL) 5 MG tablet Take 1 tablet (5 mg total) by mouth 3 (three) times daily as needed for muscle spasms.  . predniSONE (DELTASONE) 20 MG tablet Take 2 tablets (40 mg total) by mouth daily with breakfast.   No facility-administered encounter medications on file as of 10/17/2019.   Patient Active Problem List   Diagnosis Date Noted  . Acute midline thoracic back pain 10/17/2019  . Viral illness 07/08/2019  . ADHD 03/03/2019  . Sore throat 01/28/2019  . Insomnia 12/20/2018  . Vitamin D deficiency 03/20/2018  . Migraine 02/09/2018  . Depression 02/09/2018  . 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 Anxiety   . Concussion   . Depression   . Dyschromia   . Head ache   . Migraine    Relevant past medical, surgical, family and social history reviewed and updated as indicated. Interim medical history since our last visit reviewed.  Review of Systems  Constitutional: Positive for activity change. Negative for appetite change, chills, fatigue and fever.  Respiratory: Negative.  Negative for shortness of breath.   Cardiovascular: Negative.  Negative for chest pain and palpitations.  Gastrointestinal: Positive for nausea. Negative for constipation, diarrhea and vomiting.  Musculoskeletal: Positive for back pain, gait problem and neck pain. Negative for arthralgias, joint swelling, myalgias and neck stiffness.  Skin: Negative.  Negative for color change, pallor and wound.  Neurological: Positive for dizziness and numbness. Negative for weakness, light-headedness and headaches.  Hematological: Negative.  Negative for adenopathy. Does not bruise/bleed easily.  Psychiatric/Behavioral: Positive for sleep disturbance. Negative for behavioral problems, confusion and decreased concentration. The patient is not nervous/anxious.     Per HPI unless specifically indicated above     Objective:    BP 122/63 (BP Location: Left Arm, Patient Position: Sitting, Cuff Size: Normal)   Pulse 98   Temp 98.1 F (36.7 C) (Oral)   Ht 5\' 1"  (1.549 m)   Wt 144 lb (65.3 kg)   SpO2 92%   BMI 27.21 kg/m   Wt Readings from Last 3 Encounters:  10/17/19 144 lb (65.3 kg)  09/10/19 147 lb (66.7 kg)  07/08/19 135 lb (61.2 kg)    Physical Exam Vitals and nursing note reviewed.  Constitutional:      [name]: She is not in acute distress.    Appearance: Normal appearance. She is not toxic-appearing.  Eyes:     [name]: Lids are normal. No visual field deficit or scleral icterus.    Extraocular Movements: Extraocular movements intact.      Right eye: No nystagmus.     Left eye: No nystagmus.     Pupils: Pupils are equal, round, and reactive to light.  Cardiovascular:     Rate and Rhythm: Normal rate and regular rhythm.     Heart sounds: Normal heart sounds. No murmur.  Pulmonary:     Effort: Pulmonary effort is normal. No respiratory distress.     Breath sounds: Normal breath sounds. No wheezing or rhonchi.  Musculoskeletal:        [name]: Normal range of motion.     Cervical back: Normal range of motion and neck supple. Tenderness present. No rigidity.     Right lower leg: No edema.     Left lower leg: No edema.  Lymphadenopathy:     Cervical: No cervical adenopathy.  Skin:    [name]: Skin is warm and dry.     Capillary Refill:  Capillary refill takes less than 2 seconds.     Coloration: Skin is not jaundiced or pale.     Findings: No bruising or rash.  Neurological:     [name]: No focal deficit present.     Mental Status: She is alert and oriented to person, place, and time.     Cranial Nerves: Cranial nerves are intact. No facial asymmetry.     Sensory: Sensation is intact.     Motor: Weakness present. No tremor or atrophy.     Coordination: Coordination is intact. Finger-Nose-Finger Test normal.     Gait: Gait is intact. Gait normal.       Assessment & Plan:   Problem List Items Addressed This Visit      Other   Acute midline thoracic back pain - Primary    Acute, ongoing.  No red flags on history or with examination.  Given history of soccer injuries with similar symptoms, likely muscle strain.  Will treat conservatively with muscle relaxant and oral steroid to decrease inflammation.  Back exercise handout given.  If not better next week, referral to PT may be indicated, this was discussed with the patient.  May consider imaging if pain/symptoms not improving in 2-6 weeks.  Follow closely, return in 1 week to reassess symptoms and follow up in migraines with pcp.  With any sudden increase in pain, paralysis  of extremities, or incontinence or urine or stool, go to ED.  Note given for work.      Relevant Medications   predniSONE (DELTASONE) 20 MG tablet   cyclobenzaprine (FLEXERIL) 5 MG tablet       Follow up plan: Return in about 1 week (around 10/24/2019) for back pain follow up and migraine follow up with Fleet Contras .

## 2019-10-24 ENCOUNTER — Ambulatory Visit (INDEPENDENT_AMBULATORY_CARE_PROVIDER_SITE_OTHER): Payer: Commercial Managed Care - PPO | Admitting: Family Medicine

## 2019-10-24 ENCOUNTER — Ambulatory Visit: Payer: Commercial Managed Care - PPO | Admitting: Family Medicine

## 2019-10-24 ENCOUNTER — Encounter: Payer: Self-pay | Admitting: Family Medicine

## 2019-10-24 VITALS — Wt 145.0 lb

## 2019-10-24 DIAGNOSIS — G43909 Migraine, unspecified, not intractable, without status migrainosus: Secondary | ICD-10-CM | POA: Diagnosis not present

## 2019-10-24 DIAGNOSIS — M542 Cervicalgia: Secondary | ICD-10-CM | POA: Diagnosis not present

## 2019-10-24 DIAGNOSIS — M546 Pain in thoracic spine: Secondary | ICD-10-CM | POA: Diagnosis not present

## 2019-10-24 DIAGNOSIS — G8929 Other chronic pain: Secondary | ICD-10-CM | POA: Diagnosis not present

## 2019-10-24 MED ORDER — AIMOVIG 70 MG/ML ~~LOC~~ SOAJ: 140.0000 mg | SUBCUTANEOUS | 2 refills | Status: DC

## 2019-10-24 MED ORDER — TOPIRAMATE 100 MG PO TABS
100.0000 mg | ORAL_TABLET | Freq: Two times a day (BID) | ORAL | 0 refills | Status: DC
Start: 2019-10-24 — End: 2019-10-25

## 2019-10-24 NOTE — Progress Notes (Signed)
Wt 145 lb (65.8 kg)   BMI 27.40 kg/m    Subjective:    Patient ID: Brooke [name], female    DOB: 29-Apr-1996, 24 y.o.   MRN: [id number]  HPI: Brooke [name] is a 24 y.o. female  Chief Complaint  Patient presents with  . Back Pain  . Migraine    . This visit was completed via telephone due to the restrictions of the COVID-19 pandemic. All issues as above were discussed and addressed. Physical exam was done as above through visual confirmation on telephone. If it was felt that the patient should be evaluated in the office, they were directed there. The patient verbally consented to this visit. . Location of the patient: work . Location of the provider: work . Those involved with this call:  . Provider: Merrie Roof, PA-C . CMA: Lesle Chris, [place] . Front Desk/Registration: Jill Side  . Time spent on call: 25 minutes on the phone discussing health concerns. 5 minutes total spent in review of patient's record and preparation of their chart. I verified patient identity using two factors (patient name and date of birth). Patient consents verbally to being seen via telemedicine visit today.   **Due to connection issues, was only able to connect via video for several seconds prior to losing connection and video frame freezing. Visit was converted to telephone at this time out of necessity.   Following up today on back pain and migraines.   Presented less than 2 weeks ago for mid-upper back pain after a fall onto some furniture directly hitting upper back. Taking the prednisone, flexeril and gabapentin with no relief from mid back pain after a fall last week. The pain radiates up to neck and sometimes down through lower spine. Denies extremity weakness or numbness, swelling, significant bruising or immobility. Does have a long hx of thoracic back issues for which she's seen specialists in the past.   Did not notice much benefit from [place] for her migraines. Still takes cymbalta,  gabapentin, and topamax as well as prn maxalt as well but still having several migraines weekly. Has been followed by Neurology/concussion specialist for years for these but has not been in some time now.   Relevant past medical, surgical, family and social history reviewed and updated as indicated. Interim medical history since our last visit reviewed. Allergies and medications reviewed and updated.  Review of Systems  Per HPI unless specifically indicated above     Objective:    Wt 145 lb (65.8 kg)   BMI 27.40 kg/m   Wt Readings from Last 3 Encounters:  10/24/19 145 lb (65.8 kg)  10/17/19 144 lb (65.3 kg)  09/10/19 147 lb (66.7 kg)    Physical Exam  Unable to perform PE due to technical difficulties with internet connection during visit  Results for orders placed or performed in visit on 09/12/19  Novel Coronavirus, NAA ([facility])   Specimen: Nasopharyngeal(NP) swabs in vial transport medium   NASOPHARYNGE  TESTING  Result Value Ref Range   SARS-CoV-2, NAA Not Detected Not Detected  SARS-COV-2, NAA 2 DAY TAT   NASOPHARYNGE  TESTING  Result Value Ref Range   SARS-CoV-2, NAA 2 DAY TAT Performed       Assessment & Plan:   Problem List Items Addressed This Visit      Cardiovascular and Mediastinum   Migraine    WIll trial increasing aimovig to 2 pens monthly given poor response to 1 pen monthly dose. Will also increase topamax dose. F/u  with Neurology if not finding benefit with these changes. Continue remainder of regimen as before      Relevant Medications   Erenumab-aooe (AIMOVIG) 70 MG/ML SOAJ    Other Visit Diagnoses    Chronic neck pain    -  Primary   Will obtain x-rays given poor benefit with prednisone and other supportive measures. Continue home care, prn medications. Light duty note given x 2 weeks   Relevant Orders   DG Cervical Spine Complete   Chronic midline thoracic back pain       Relevant Orders   DG Thoracic Spine W/Swimmers       Follow up  plan: Return in about 2 weeks (around 11/07/2019) for back pain, migraine f/u.

## 2019-10-25 ENCOUNTER — Other Ambulatory Visit: Payer: Self-pay | Admitting: Family Medicine

## 2019-10-25 ENCOUNTER — Telehealth: Payer: Self-pay

## 2019-10-25 NOTE — Telephone Encounter (Signed)
Prior Authorization initiated via CoverMyMeds for Aimovig Key: [id number]  Will await progress notes to be signed before sending.  Please note, PA is asking: What is the medical reason for the need to override the quantity level limit? Routing to provider.

## 2019-10-25 NOTE — Telephone Encounter (Signed)
   Notes to clinic:  Patient requesting a 90 day   Requested Prescriptions  Pending Prescriptions Disp Refills   topiramate (TOPAMAX) 100 MG tablet [Pharmacy Med Name: TOPIRAMATE 100MG  TABLETS] 180 tablet     Sig: TAKE 1 TABLET(100 MG) BY MOUTH TWICE DAILY      Not Delegated - Neurology: Anticonvulsants - topiramate & zonisamide Failed - 10/25/2019  7:44 AM      Failed - This refill cannot be delegated      Failed - Cr in normal range and within 360 days    No results found for: CREATININE, LABCREAU, LABCREA, POCCRE        Failed - CO2 in normal range and within 360 days    No results found for: POCCO2, CO2, HCO3        Passed - Valid encounter within last 12 months    Recent Outpatient Visits           Yesterday    [facility], Particia Nearing   1 week ago Acute midline thoracic back pain   [facility] I, NP   1 month ago Migraine without status migrainosus, not intractable, unspecified migraine type   [facility] [place], [place]   3 months ago Viral illness   [facility], NP   7 months ago Mouth sore   [facility] [place], [place]

## 2019-10-25 NOTE — Telephone Encounter (Signed)
Can this be changed to a 90 day supply?

## 2019-10-27 NOTE — Assessment & Plan Note (Signed)
WIll trial increasing aimovig to 2 pens monthly given poor response to 1 pen monthly dose. Will also increase topamax dose. F/u with Neurology if not finding benefit with these changes. Continue remainder of regimen as before

## 2019-10-28 ENCOUNTER — Telehealth: Payer: Self-pay | Admitting: Family Medicine

## 2019-10-28 NOTE — Telephone Encounter (Signed)
Note signed, no improvement on 70 mg dose so moving up to 140 mg dose is reason for 2 pen monthly

## 2019-10-28 NOTE — Telephone Encounter (Signed)
-----   Message from Particia Nearing, [place] sent at 10/27/2019  9:53 PM EDT ----- 2 week migraine, back pain f/u

## 2019-10-28 NOTE — Telephone Encounter (Signed)
PA denied. Should get a fax as to reasoning. Routing to provider for [employer]

## 2019-10-28 NOTE — Telephone Encounter (Signed)
lvm to make this 2 week f.u apt

## 2019-10-28 NOTE — Telephone Encounter (Signed)
PA sent to plan

## 2019-10-29 NOTE — Telephone Encounter (Signed)
lvm to make this 2 week f.u apt  

## 2019-10-31 ENCOUNTER — Telehealth: Payer: Self-pay

## 2019-10-31 NOTE — Telephone Encounter (Signed)
Called patient to let her know that the appeal form for her medication was filled out and patient needs to sign the consent form also. Patient stated she will come by today to fill it out so that the paper work can be faxed over. Paperwork by the providers folders

## 2019-11-12 ENCOUNTER — Telehealth: Payer: Commercial Managed Care - PPO | Admitting: Family Medicine

## 2019-11-12 NOTE — Telephone Encounter (Signed)
Consent form not needed because we are prescribing office of medication.  Covermymeds sent an example consent form if information coming from doctor's office other than prescribing provider or from the patient.   Appeal information faxed.

## 2019-11-13 ENCOUNTER — Encounter: Payer: Self-pay | Admitting: Family Medicine

## 2019-11-14 ENCOUNTER — Telehealth: Payer: Self-pay

## 2019-11-14 NOTE — Telephone Encounter (Signed)
I have an appeal for patient for her Aimovig with the dosage increase.  I tried faxing (most don't need a patient consent form when coming from prescriber) but [employer] requires consent form. Consent form up front for patient to sign so we can fax her appeal.  In the meantime, I did get her Aimovig approved through her Medicaid for 90 days.  Confirmation #:[id number] W Prior Approval [phone]  I tried calling pharmacy to confirm, but was on hold for 10 + minutes. Will try again.

## 2019-11-14 NOTE — Telephone Encounter (Signed)
Spoke with pharmacy. Aimovig went through under Medicaid for 2 mL for 30 days. It's approved until September.   Tried calling patient for her to come sign consent form for the appeal and let her know her Aimovig is ready at the pharmacy. Let her know I got it approved through medicaid until we can get the appeal to go through [employer].  DPR Reviewed-Left detailed message.

## 2019-11-14 NOTE — Telephone Encounter (Signed)
Pt signed consent.

## 2019-12-08 ENCOUNTER — Other Ambulatory Visit: Payer: Self-pay | Admitting: Family Medicine

## 2019-12-08 NOTE — Telephone Encounter (Signed)
Requested Prescriptions  Pending Prescriptions Disp Refills  . DULoxetine (CYMBALTA) 60 MG capsule [Pharmacy Med Name: DULOXETINE DR 60MG  CAPSULES] 90 capsule 1    Sig: TAKE ONE CAPSULE BY MOUTH DAILY     Psychiatry: Antidepressants - SNRI Passed - 12/08/2019  3:29 AM      Passed - Completed PHQ-2 or PHQ-9 in the last 360 days.      Passed - Last BP in normal range    BP Readings from Last 1 Encounters:  10/17/19 122/63         Passed - Valid encounter within last 6 months    Recent Outpatient Visits          1 month ago Chronic neck pain   [facility] [place], [place]   1 month ago Acute midline thoracic back pain   [facility] I, NP   2 months ago Migraine without status migrainosus, not intractable, unspecified migraine type   [facility] [place], [place]   5 months ago Viral illness   [facility], NP   8 months ago Mouth sore   [facility] [place], [place]

## 2020-01-29 ENCOUNTER — Other Ambulatory Visit: Payer: Self-pay | Admitting: Family Medicine

## 2020-01-29 DIAGNOSIS — B349 Viral infection, unspecified: Secondary | ICD-10-CM

## 2020-01-29 NOTE — Telephone Encounter (Signed)
Requested medication (s) are due for refill today:Both  yes  Requested medication (s) are on the active medication list:Both  yes  Last refill:Phenergan   07/08/19  #30   0 refills      Topamax   10/25/19  #180  0 refills  Future visit scheduled   no  Notes to clinic:not delegated  Requested Prescriptions  Pending Prescriptions Disp Refills   promethazine (PHENERGAN) 25 MG tablet [Pharmacy Med Name: PROMETHAZINE 25MG  TABLETS] 30 tablet 0    Sig: TAKE 1 TABLET BY MOUTH EVERY 8 HOURS AS NEEDED FOR NAUSEA AND VOMITING      Not Delegated - Gastroenterology: Antiemetics Failed - 01/29/2020  5:44 PM      Failed - This refill cannot be delegated      Passed - Valid encounter within last 6 months    Recent Outpatient Visits           3 months ago Chronic neck pain   [facility], Particia Nearing   3 months ago Acute midline thoracic back pain   [facility], NP   4 months ago Migraine without status migrainosus, not intractable, unspecified migraine type   [facility] [place], [place]   6 months ago Viral illness   [facility], NP   10 months ago Mouth sore   [facility], Particia Nearing                topiramate (TOPAMAX) 100 MG tablet [Pharmacy Med Name: TOPIRAMATE 100MG  TABLETS] 180 tablet 0    Sig: TAKE 1 TABLET(100 MG) BY MOUTH TWICE DAILY      Not Delegated - Neurology: Anticonvulsants - topiramate & zonisamide Failed - 01/29/2020  5:44 PM      Failed - This refill cannot be delegated      Failed - Cr in normal range and within 360 days    No results found for: CREATININE, LABCREAU, LABCREA, POCCRE        Failed - CO2 in normal range and within 360 days    No results found for: POCCO2, CO2, HCO3        Passed - Valid encounter within last 12 months    Recent Outpatient Visits           3 months ago Chronic neck pain   [facility] 01/31/2020 [place], Roosvelt Maser   3 months ago Acute midline thoracic back pain   [facility] [place] A, NP   4 months ago Migraine without status migrainosus, not intractable, unspecified migraine type   [facility] [place], [place] M[place]   6 months ago Viral illness   [facility] [place], NP   10 months ago Mouth sore   [facility] [place], [place] M[place]

## 2020-01-30 NOTE — Telephone Encounter (Signed)
LOV 10/2019

## 2020-02-03 ENCOUNTER — Telehealth: Payer: Self-pay

## 2020-02-03 NOTE — Telephone Encounter (Signed)
PA for Aimovig initiated and submitted via Cover My Meds. Key: [id number]

## 2020-02-04 NOTE — Telephone Encounter (Signed)
PA denied. Will wait for determination letter as to reason for denial.

## 2020-02-12 ENCOUNTER — Other Ambulatory Visit: Payer: Self-pay | Admitting: Nurse Practitioner

## 2020-02-12 MED ORDER — AIMOVIG 140 MG/ML ~~LOC~~ SOAJ: 140.0000 mg | SUBCUTANEOUS | 6 refills | Status: DC

## 2020-02-12 NOTE — Telephone Encounter (Signed)
Received letter regarding medication. It states that coverage is provided for 1 syringe/auto injector at a retail pharmacy. RX was written to do 2 70 mg injections monthly. Can we change the RX to the 140 mg pens, 1 per month to see if insurance will cover them?

## 2020-02-12 NOTE — Telephone Encounter (Signed)
Sent in the 140 MG monthly dosing to see if covered.

## 2020-02-12 NOTE — Telephone Encounter (Signed)
Called and left patient a detailed VM regarding medication. (verified DPR) Asked for patient to call back with any questions or concerns.

## 2020-03-10 ENCOUNTER — Other Ambulatory Visit: Payer: Self-pay | Admitting: Family Medicine

## 2020-03-10 DIAGNOSIS — B349 Viral infection, unspecified: Secondary | ICD-10-CM

## 2020-03-10 NOTE — Telephone Encounter (Signed)
Copied from CRM [id number]. Topic: General - Inquiry >> Mar 10, 2020  3:55 PM Daphine Deutscher D wrote: Reason for CRM: Pt called saying she called the pharmacy today and request refills on Phenergan and Seroquel 100 mg.  She stated she is taking 2 of the 100 mgs of the generic Seroquel a day.  She said she discussed this with Roosvelt Maser before she left but the Rx was never changed at the pharmacy.  CB# [id number]

## 2020-03-10 NOTE — Telephone Encounter (Signed)
Requested medication (s) are due for refill today:  Yes  Requested medication (s) are on the active medication list:  Yes  Future visit scheduled:  No  Last Refill: Promethazine 01/30/20                   Quetiapine  09/2019  Requested Prescriptions  Pending Prescriptions Disp Refills   promethazine (PHENERGAN) 25 MG tablet [Pharmacy Med Name: PROMETHAZINE 25MG  TABLETS] 30 tablet 0    Sig: TAKE 1 TABLET BY MOUTH EVERY 8 HOURS AS NEEDED FOR NAUSEA AND VOMITING      Not Delegated - Gastroenterology: Antiemetics Failed - 03/10/2020  4:02 PM      Failed - This refill cannot be delegated      Passed - Valid encounter within last 6 months    Recent Outpatient Visits           4 months ago Chronic neck pain   [facility], [place]   4 months ago Acute midline thoracic back pain   [facility], NP   6 months ago Migraine without status migrainosus, not intractable, unspecified migraine type   [facility] [place], [place]   8 months ago Viral illness   [facility], NP   12 months ago Mouth sore   [facility] [place], [place]                QUEtiapine (SEROQUEL) 100 MG tablet [Pharmacy Med Name: QUETIAPINE 100MG  TABLETS] 90 tablet 0    Sig: TAKE 1 TABLET BY MOUTH AT BEDTIME      Not Delegated - Psychiatry:  Antipsychotics - Second Generation (Atypical) - quetiapine Failed - 03/10/2020  4:02 PM      Failed - This refill cannot be delegated      Failed - ALT in normal range and within 180 days    No results found for: ALT, LABALT, POCALT        Failed - AST in normal range and within 180 days    No results found for: POCAST, AST        Passed - Completed PHQ-2 or PHQ-9 in the last 360 days.      Passed - Last BP in normal range    BP Readings from Last 1 Encounters:  10/17/19 122/63          Passed - Valid encounter within  last 6 months    Recent Outpatient Visits           4 months ago Chronic neck pain   [facility] 10/19/19 [place], [place]   4 months ago Acute midline thoracic back pain   [facility] [place], [place]   6 months ago Migraine without status migrainosus, not intractable, unspecified migraine type   [facility] [place], [place]   8 months ago Viral illness   [facility] [place], NP   12 months ago Mouth sore   [facility] [employer], [place]

## 2020-04-19 ENCOUNTER — Telehealth: Payer: Self-pay | Admitting: Nurse Practitioner

## 2020-04-19 NOTE — Telephone Encounter (Signed)
Requested medication (s) are due for refill today: no  Requested medication (s) are on the active medication list: yes  Last refill:  03/10/20  Future visit scheduled: no  Notes to clinic:  med not delegated to NT to RF   Requested Prescriptions  Pending Prescriptions Disp Refills   QUEtiapine (SEROQUEL) 100 MG tablet [Pharmacy Med Name: QUETIAPINE 100MG  TABLETS] 90 tablet 0    Sig: TAKE 1 TABLET BY MOUTH AT BEDTIME      Not Delegated - Psychiatry:  Antipsychotics - Second Generation (Atypical) - quetiapine Failed - 04/19/2020  5:58 PM      Failed - This refill cannot be delegated      Failed - ALT in normal range and within 180 days    No results found for: ALT, LABALT, POCALT        Failed - AST in normal range and within 180 days    No results found for: POCAST, AST        Passed - Completed PHQ-2 or PHQ-9 in the last 360 days      Passed - Last BP in normal range    BP Readings from Last 1 Encounters:  10/17/19 122/63          Passed - Valid encounter within last 6 months    Recent Outpatient Visits           5 months ago Chronic neck pain   [facility] [place], [place]   6 months ago Acute midline thoracic back pain   [facility], NP   7 months ago Migraine without status migrainosus, not intractable, unspecified migraine type   [facility] [place], [place]   9 months ago Viral illness   [facility], NP   1 year ago Mouth sore   [facility] [place], [place]

## 2020-04-20 NOTE — Telephone Encounter (Signed)
Patient need appointment in next month for further refills

## 2020-04-20 NOTE — Telephone Encounter (Signed)
Patient called to ask why the pharmacy is not filling her prescription for QUEtiapine (SEROQUEL) 100 MG tablet.  Patient stated that she has been taking two tablets instead of one tablet.  Please call patient to discuss at [phone]

## 2020-04-20 NOTE — Telephone Encounter (Signed)
Lvm to make this apt. 

## 2020-04-20 NOTE — Telephone Encounter (Signed)
Pt returned office call. Pt has been scheduled. Pt says that she has been taking 2 tablet instead of 1 as prescribed. Pt says that 1 doesn't help her sleep she has to take 2.   Pt says that taking 2 causes her to run out of her medication sooner.

## 2020-04-23 ENCOUNTER — Encounter: Payer: Self-pay | Admitting: Nurse Practitioner

## 2020-04-23 ENCOUNTER — Ambulatory Visit (INDEPENDENT_AMBULATORY_CARE_PROVIDER_SITE_OTHER): Admitting: Nurse Practitioner

## 2020-04-23 ENCOUNTER — Other Ambulatory Visit: Payer: Self-pay

## 2020-04-23 VITALS — BP 107/74 | HR 121 | Temp 98.6°F | Wt 150.0 lb

## 2020-04-23 DIAGNOSIS — G43101 Migraine with aura, not intractable, with status migrainosus: Secondary | ICD-10-CM | POA: Diagnosis not present

## 2020-04-23 DIAGNOSIS — G4700 Insomnia, unspecified: Secondary | ICD-10-CM | POA: Diagnosis not present

## 2020-04-23 MED ORDER — NAPROXEN 500 MG PO TABS: 500.0000 mg | ORAL_TABLET | Freq: Two times a day (BID) | ORAL | 1 refills | Status: DC

## 2020-04-23 MED ORDER — QUETIAPINE FUMARATE 200 MG PO TABS: 200.0000 mg | ORAL_TABLET | Freq: Every day | ORAL | 1 refills | Status: DC

## 2020-04-23 MED ORDER — PROMETHAZINE HCL 25 MG PO TABS: 25.0000 mg | ORAL_TABLET | Freq: Three times a day (TID) | ORAL | 0 refills | Status: DC | PRN

## 2020-04-23 MED ORDER — RIZATRIPTAN BENZOATE 10 MG PO TABS: ORAL_TABLET | ORAL | 6 refills | Status: DC

## 2020-04-23 NOTE — Patient Instructions (Signed)

## 2020-04-23 NOTE — Assessment & Plan Note (Signed)
Chronic, ongoing.  Increase Seroquel to 200 mg nightly and monitor for benefit.  Follow up in 6 months.

## 2020-04-23 NOTE — Progress Notes (Signed)
BP 107/74   Pulse (!) 121   Temp 98.6 F (37 C)   Wt 150 lb (68 kg)   SpO2 99%   BMI 28.34 kg/m    Subjective:    Patient ID: Brooke [name], female    DOB: 1995-06-09, 24 y.o.   MRN: [id number]  HPI: Brooke [name] is a 24 y.o. female presenting with migraine.  Chief Complaint  Patient presents with  . Migraine    pt states she has had a migraine for 3 days   . Medication Refill    pt states she needs a higher dose of a medication    MIGRAINE Reports migraine started Monday, and "nothing is touching it."  Reports she was taking some of her fiance's medications but is not anymore due to break-up (was taking pregabalin every once and a while and hydroxyzine).  Broke up Sunday night.  Has tried: 1/2 sumatriptan, Tylenol, Topamax, ice, neck manipulation Duration: days Onset: sudden Severity: 6/10 Frequency: constant Location: back of head and through both eyes Headache duration: ongoing Radiation: yes; to front and around head Time of day headache occurs: all day Alleviating factors: nothing Aggravating factors: light, sound Headache status at time of visit: current headache Treatments attempted: rest, ice, APAP, triptans and topamax   Aura: yes Nausea:  yes Vomiting: no Photophobia:  yes Phonophobia:  yes Effect on social functioning:  yes Numbers of missed days of school/work each month: 0 Confusion:  no Gait disturbance/ataxia:  no Behavioral changes:  no Fevers:  no  INSOMNIA Has been taking 200 mg of Seroquel and this helps more with her sleep than 100 mg.  Requesting refill today. Duration: chronic Satisfied with sleep quality: no Difficulty falling asleep: yes Difficulty staying asleep: yes Waking a few hours after sleep onset: yes Early morning awakenings: yes Daytime hypersomnolence: no Wakes feeling refreshed: no Good sleep hygiene: no Apnea: no Snoring: no Depressed/anxious mood: no Recent stress: yes Restless legs/nocturnal leg cramps:  no Chronic pain/arthritis: yes History of sleep study: no Treatments attempted: increased Seroquel to 200 mg   Allergies  Allergen Reactions  . Sulfa Antibiotics   . Wellbutrin [Bupropion] Anxiety   Outpatient Encounter Medications as of 04/23/2020  Medication Sig  . cyclobenzaprine (FLEXERIL) 5 MG tablet Take 1 tablet (5 mg total) by mouth 3 (three) times daily as needed for muscle spasms.  Dorise Hiss (AIMOVIG) 140 MG/ML SOAJ Inject 140 mg into the skin every 30 (thirty) days.  Marland Kitchen gabapentin (NEURONTIN) 300 MG capsule Take 1 capsule (300 mg total) by mouth 3 (three) times daily as needed.  . ondansetron (ZOFRAN-ODT) 4 MG disintegrating tablet Take 1 tablet (4 mg total) by mouth every 8 (eight) hours as needed for nausea or vomiting.  . promethazine (PHENERGAN) 25 MG tablet Take 1 tablet (25 mg total) by mouth every 8 (eight) hours as needed for nausea or vomiting.  Marland Kitchen QUEtiapine (SEROQUEL) 200 MG tablet Take 1 tablet (200 mg total) by mouth at bedtime. Needs appointment for further refills, TAKE 1 TABLET BY MOUTH AT BEDTIME  . rizatriptan (MAXALT) 10 MG tablet Take 1 tab at first sign of migraine. May repeat in 2 hours if needed  . topiramate (TOPAMAX) 100 MG tablet TAKE 1 TABLET(100 MG) BY MOUTH TWICE DAILY  . Vitamin D, Ergocalciferol, (DRISDOL) 50000 units CAPS capsule Take 1 capsule (50,000 Units total) by mouth every 7 (seven) days.  . [DISCONTINUED] promethazine (PHENERGAN) 25 MG tablet TAKE 1 TABLET BY MOUTH EVERY 8 HOURS AS NEEDED  FOR NAUSEA AND VOMITING  . [DISCONTINUED] QUEtiapine (SEROQUEL) 100 MG tablet Needs appointment for further refills, TAKE 1 TABLET BY MOUTH AT BEDTIME  . [DISCONTINUED] rizatriptan (MAXALT) 10 MG tablet Take 1 tab at first sign of migraine. May repeat in 2 hours if needed  . acetaminophen (TYLENOL) 325 MG tablet Take by mouth as needed.  (Patient not taking: Reported on 04/23/2020)  . atomoxetine (STRATTERA) 60 MG capsule TAKE 1 CAPSULE(60 MG) BY MOUTH  DAILY (Patient not taking: Reported on 04/23/2020)  . naproxen (NAPROSYN) 500 MG tablet Take 1 tablet (500 mg total) by mouth 2 (two) times daily with a meal.  . valACYclovir (VALTREX) 1000 MG tablet Take 1 tablet (1,000 mg total) by mouth 2 (two) times daily. (Patient not taking: Reported on 04/23/2020)  . [DISCONTINUED] DULoxetine (CYMBALTA) 60 MG capsule TAKE ONE CAPSULE BY MOUTH DAILY (Patient not taking: Reported on 04/23/2020)  . [DISCONTINUED] naproxen (NAPROSYN) 500 MG tablet Take by mouth. (Patient not taking: Reported on 04/23/2020)  . [DISCONTINUED] predniSONE (DELTASONE) 20 MG tablet Take 2 tablets (40 mg total) by mouth daily with breakfast. (Patient not taking: Reported on 04/23/2020)   No facility-administered encounter medications on file as of 04/23/2020.   Patient Active Problem List   Diagnosis Date Noted  . Acute midline thoracic back pain 10/17/2019  . ADHD 03/03/2019  . Sore throat 01/28/2019  . Insomnia 12/20/2018  . Vitamin D deficiency 03/20/2018  . Migraine 02/09/2018  . Depression 02/09/2018  . 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 Anxiety   . Concussion   . Depression   . Dyschromia   . Head ache   . Migraine    Relevant past medical, surgical, family and social history reviewed and updated as indicated. Interim medical history since our last visit reviewed.  Review of Systems  Constitutional: Positive for appetite change and fatigue. Negative for activity change and fever.  Eyes: Positive for photophobia. Negative for visual disturbance.  Gastrointestinal: Positive for nausea. Negative for vomiting.  Skin: Negative.   Neurological: Positive for headaches. Negative for dizziness, weakness, light-headedness and numbness.  Psychiatric/Behavioral: Positive for sleep disturbance. Negative for behavioral problems, confusion, decreased concentration and hallucinations. The patient is not  nervous/anxious.     Per HPI unless specifically indicated above     Objective:    BP 107/74   Pulse (!) 121   Temp 98.6 F (37 C)   Wt 150 lb (68 kg)   SpO2 99%   BMI 28.34 kg/m   Wt Readings from Last 3 Encounters:  04/23/20 150 lb (68 kg)  10/24/19 145 lb (65.8 kg)  10/17/19 144 lb (65.3 kg)    Physical Exam Vitals and nursing note reviewed.  Constitutional:      General: She is not in acute distress.    Appearance: Normal appearance. She is not toxic-appearing.  HENT:     Head: Normocephalic and atraumatic.  Eyes:     General: No scleral icterus.       Right eye: No discharge.        Left eye: No discharge.     Extraocular Movements: Extraocular movements intact.     Pupils: Pupils are equal, round, and reactive to light.  Abdominal:     General: Abdomen is flat. There is no distension.     Palpations: Abdomen is soft.  Musculoskeletal:     Cervical back: Normal range of motion. No rigidity or tenderness.  Lymphadenopathy:  Cervical: No cervical adenopathy.  Skin:    General: Skin is warm and dry.     Capillary Refill: Capillary refill takes less than 2 seconds.     Coloration: Skin is not jaundiced or pale.  Neurological:     General: No focal deficit present.     Mental Status: She is alert and oriented to person, place, and time.     Motor: No weakness.     Gait: Gait normal.  Psychiatric:        Mood and Affect: Mood normal.        Behavior: Behavior normal.        Thought Content: Thought content normal.        Judgment: Judgment normal.        Assessment & Plan:   Problem List Items Addressed This Visit      Cardiovascular and Mediastinum   Migraine - Primary    Chronic, ongoing.  Unclear etiology - perhaps likely due to trigger from break up with fiance.  Will refill Maxalt and naproxen.  Not to take other NSAIDs if taking Naproxen.  If not better by end of week, will send referral to Neurology.  Follow up in 4 weeks.      Relevant  Medications   rizatriptan (MAXALT) 10 MG tablet   naproxen (NAPROSYN) 500 MG tablet   promethazine (PHENERGAN) 25 MG tablet     Other   Insomnia    Chronic, ongoing.  Increase Seroquel to 200 mg nightly and monitor for benefit.  Follow up in 6 months.          Follow up plan: Return in about 4 weeks (around 05/21/2020) for nausea f/u.

## 2020-04-23 NOTE — Assessment & Plan Note (Addendum)
Chronic, ongoing.  Unclear etiology - perhaps likely due to trigger from break up with fiance.  Will refill Maxalt and naproxen.  Not to take other NSAIDs if taking Naproxen.  If not better by end of week, will send referral to Neurology.  Follow up in 4 weeks.

## 2020-05-13 ENCOUNTER — Ambulatory Visit: Payer: Commercial Managed Care - PPO | Admitting: Nurse Practitioner

## 2020-05-17 ENCOUNTER — Other Ambulatory Visit: Payer: Self-pay | Admitting: Nurse Practitioner

## 2020-05-18 ENCOUNTER — Ambulatory Visit: Payer: Self-pay | Admitting: *Deleted

## 2020-05-18 NOTE — Telephone Encounter (Signed)
Called pt scheduled for 12/15 virtually being that there is no availability advised to follow instructions if worsening contact ur or uc. Advised pt that if something comes available I can call her to schedule sooner. Please advise.

## 2020-05-18 NOTE — Telephone Encounter (Signed)
Maintain 05/20/20 appointment at this point, unless sooner visit opens.  Agree if worsening symptoms immediately go to UC or ER.

## 2020-05-18 NOTE — Telephone Encounter (Signed)
Patient is calling to report she has tested + COVID- patient is requesting an appointment to discuss her symptoms and management with provider. There are no virtual appointment available and the office is closed for lunch. Advised patient she may have to do virtual visit with online provider/UC. Patient understands and will await contact from office.  Reason for Disposition . Fever present > 3 days (72 hours)  Answer Assessment - Initial Assessment Questions 1. COVID-19 DIAGNOSIS: "Who made your Coronavirus (COVID-19) diagnosis?" "Was it confirmed by a positive lab test?" If not diagnosed by a HCP, ask "Are there lots of cases (community spread) where you live?" (See public health department website, if unsure)     Health dept 2. COVID-19 EXPOSURE: "Was there any known exposure to COVID before the symptoms began?" CDC Definition of close contact: within 6 feet (2 meters) for a total of 15 minutes or more over a 24-hour period.      Patient had exposure at work 3. ONSET: "When did the COVID-19 symptoms start?"      05/14/20 4. WORST SYMPTOM: "What is your worst symptom?" (e.g., cough, fever, shortness of breath, muscle aches)     Body aches 5. COUGH: "Do you have a cough?" If Yes, ask: "How bad is the cough?"       Yes- not persistant 6. FEVER: "Do you have a fever?" If Yes, ask: "What is your temperature, how was it measured, and when did it start?"     Checked last night- 101 7. RESPIRATORY STATUS: "Describe your breathing?" (e.g., shortness of breath, wheezing, unable to speak)      No problems breathing- some chest tightness and stuffy nose 8. BETTER-SAME-WORSE: "Are you getting better, staying the same or getting worse compared to yesterday?"  If getting worse, ask, "In what way?"     Same- no improvement in symptoms 9. HIGH RISK DISEASE: "Do you have any chronic medical problems?" (e.g., asthma, heart or lung disease, weak immune system, obesity, etc.)     no 10. PREGNANCY: "Is there any  chance you are pregnant?" "When was your last menstrual period?"       No- LMP- 05/10/20 11. OTHER SYMPTOMS: "Do you have any other symptoms?"  (e.g., chills, fatigue, headache, loss of smell or taste, muscle pain, sore throat; new loss of smell or taste especially support the diagnosis of COVID-19)       Fatigue, muscle aches, headache, congestion, loss of taste, ear congestion  Protocols used: CORONAVIRUS (COVID-19) DIAGNOSED OR SUSPECTED-A-AH

## 2020-05-19 NOTE — Progress Notes (Signed)
Virtual Visit via Video Note  I connected with Korissa Jividen on 05/20/20 at  8:20 AM EST by a video enabled telemedicine application and verified that I am speaking with the correct person using two identifiers.  Location: Patient: home Provider: [facility]   I discussed the limitations of evaluation and management by telemedicine and the availability of in person appointments. The patient expressed understanding and agreed to proceed.  History of Present Illness:  UPPER RESPIRATORY TRACT INFECTION - +COVID tested at HD on Thursday.  - symptom onset 05/13/20. Worst symptom: body aches Fever: subjective, hasn't checked Cough: yes, nonproductive Shortness of breath: with exertion Chest pain: no Chest tightness: yes Chest congestion: yes Nasal congestion: yes Runny nose: no Sneezing: no Sore throat: yes Sinus pressure: yes Headache: yes Ear pain: yes bilateral Ear pressure: yes bilateral Eyes red/itching:no Eye drainage/crusting: no  Vomiting: no Rash: no Fatigue: yes  Lost taste and smell. Sick contacts: yes, coworker's boyfriend +COVID, coworker with symptoms Context: stable Recurrent sinusitis: no Relief with OTC cold/cough medications: hasn't tried  Treatments attempted: vitamins, tylenol   Observations/Objective:  Patient couldn't connect to video visit entirety of visit conducted over the phone.  Speaks in full sentences, no respiratory distress.   Assessment and Plan:  COVID-19 +COVID19 05/14/20. Mainly URI sx and body aches. Discussed self-quarantine instructions and symptomatic care, see AVS for details. Note provided for work. Emergency precautions discussed.     I discussed the assessment and treatment plan with the patient. The patient was provided an opportunity to ask questions and all were answered. The patient agreed with the plan and demonstrated an understanding of the instructions.   The patient was advised to call back or seek an in-person evaluation  if the symptoms worsen or if the condition fails to improve as anticipated.  I provided 13 minutes of non-face-to-face time during this encounter.   Caro Laroche, DO

## 2020-05-20 ENCOUNTER — Telehealth (INDEPENDENT_AMBULATORY_CARE_PROVIDER_SITE_OTHER): Admitting: Family Medicine

## 2020-05-20 ENCOUNTER — Encounter: Payer: Self-pay | Admitting: Family Medicine

## 2020-05-20 DIAGNOSIS — U071 COVID-19: Secondary | ICD-10-CM | POA: Diagnosis not present

## 2020-05-20 DIAGNOSIS — Z8616 Personal history of COVID-19: Secondary | ICD-10-CM | POA: Insufficient documentation

## 2020-05-20 NOTE — Assessment & Plan Note (Signed)
+  COVID19 05/14/20. Mainly URI sx and body aches. Discussed self-quarantine instructions and symptomatic care, see AVS for details. Note provided for work. Emergency precautions discussed.

## 2020-05-20 NOTE — Patient Instructions (Signed)
I am so sorry you have COVID!   For your cough, try cough drops or honey. Lemon can help soothe a sore throat. Try Mucinex for congestion.  For your nasal congestion and runny nose, try using Afrin (generic is Oxymetazoline) twice daily for 3 days.  Do not use for longer that 3 days.    Some other therapies you can try are: push fluids, rest, gargle warm salt water, use vaporizer or mist and apply heat to sinuses as needed.   Drinking warm liquids such as teas and soups can help with secretions and cough. A mist humidifier or vaporizer can work well to help with secretions and cough.  It is very important to clean the humidifier between use according to the instructions.    You should self-quarantine until you are 10 days from symptom onset and at least 24 hours without a fever. Self-quarantine means that you do not leave your house. You do not go to the store or drive around. You try to avoid the other people who live with you (stay in a different room and wear a mask when you have to be in contact with them.) Your family members and house-mates need to also be aware that you are being tested in case they develop any symptoms. If you become significantly worse and cannot breathe or vomiting so much that you cannot hold down liquids, go to the hospital.

## 2020-05-27 ENCOUNTER — Ambulatory Visit: Admitting: Nurse Practitioner

## 2020-06-06 ENCOUNTER — Encounter: Payer: Self-pay | Admitting: Nurse Practitioner

## 2020-06-07 ENCOUNTER — Other Ambulatory Visit: Payer: Self-pay | Admitting: Family Medicine

## 2020-06-08 NOTE — Telephone Encounter (Signed)
Requested medication (s) are due for refill today: yes  Requested medication (s) are on the active medication list: yes  Last refill:  03/10/2020  Future visit scheduled: yes  Notes to clinic:  this refill cannot be delegated    Requested Prescriptions  Pending Prescriptions Disp Refills   topiramate (TOPAMAX) 100 MG tablet [Pharmacy Med Name: TOPIRAMATE 100MG  TABLETS] 180 tablet 0    Sig: TAKE 1 TABLET(100 MG) BY MOUTH TWICE DAILY      Not Delegated - Neurology: Anticonvulsants - topiramate & zonisamide Failed - 06/07/2020  3:19 AM      Failed - This refill cannot be delegated      Failed - Cr in normal range and within 360 days    No results found for: CREATININE, LABCREAU, LABCREA, POCCRE        Failed - CO2 in normal range and within 360 days    No results found for: POCCO2, CO2, HCO3        Passed - Valid encounter within last 12 months    Recent Outpatient Visits           2 weeks ago COVID-19   [facility], DO   1 month ago Migraine with aura and with status migrainosus, not intractable   [facility], NP   7 months ago Chronic neck pain   [facility] [place], [place]   7 months ago Acute midline thoracic back pain   [facility] A, NP   9 months ago Migraine without status migrainosus, not intractable, unspecified migraine type   [facility], [place] Hews       Future Appointments             In 2 days [place], [place], NP Dorie Rank, [facility]

## 2020-06-08 NOTE — Telephone Encounter (Signed)
Pt scheduled  

## 2020-06-09 ENCOUNTER — Telehealth (INDEPENDENT_AMBULATORY_CARE_PROVIDER_SITE_OTHER): Admitting: Family Medicine

## 2020-06-09 ENCOUNTER — Telehealth: Payer: Self-pay

## 2020-06-09 ENCOUNTER — Encounter: Payer: Self-pay | Admitting: Family Medicine

## 2020-06-09 VITALS — Wt 155.0 lb

## 2020-06-09 DIAGNOSIS — G4700 Insomnia, unspecified: Secondary | ICD-10-CM | POA: Diagnosis not present

## 2020-06-09 DIAGNOSIS — G43909 Migraine, unspecified, not intractable, without status migrainosus: Secondary | ICD-10-CM

## 2020-06-09 MED ORDER — QUETIAPINE FUMARATE 100 MG PO TABS
ORAL_TABLET | ORAL | 0 refills | Status: DC
Start: 2020-06-09 — End: 2020-10-06

## 2020-06-09 NOTE — Patient Instructions (Addendum)
It was great to see you!  Our plans for today:  - We are switching you back to the 100mg  seroquel tablets. Try taking one tablet at night before bed, you can take an additional tablet as needed. See below for tips on helping you sleep.  - come back in 2-3 months for follow up.  - Try the following to help you sleep better:  - limit naps during the day  - no screens (TV, phone, tablet, computer) at least 1-2 hours before bedtime.  - have a quiet and dark sleeping environment.  - no large meals or drinks about 1 hour before bed.  - Avoid caffeine after 3pm.  - Exercise or move your body regularly every day.  - You can also try melatonin 5 mg over the counter. Take this 1-2 hours before bed. You can increase to 10mg  if this is not helpful. - If you find your mind is racing before bed, write down your thoughts in a notepad, this can help relax your mind. - If you are lying in bed for 30 mins-1 hour and aren't falling asleep, get out of bed and do something relaxing like reading (NO TV!) until you are tired.   Take care and seek immediate care sooner if you develop any concerns.   Dr. 

## 2020-06-09 NOTE — Telephone Encounter (Signed)
Please see message. °

## 2020-06-09 NOTE — Assessment & Plan Note (Signed)
Doing well on current regimen with decreased severity. Counseled on judicious use of abortive therapy to avoid medication overuse headaches. Can consider restarting aimovig in the future if headaches become worse. No changes to regimen today, f/u in 2-3 months.

## 2020-06-09 NOTE — Telephone Encounter (Signed)
Copied from CRM [id number]. Topic: Quick Communication - See Telephone Encounter >> Jun 09, 2020  9:14 AM Aretta Nip wrote: CRM for notification. See Telephone encounter for: 06/09/20. Pt appt today request change to virtual. In appt Notes, Med refills is all needs has had appt 11/18, 12/15 and 1/5 today only needs/want virtual as feeling fine and needs to work fu if not ok [phone]

## 2020-06-09 NOTE — Assessment & Plan Note (Signed)
Will switch back to 100mg  tablets of seroquel with aim to wean down dose over time given no other indication for antipsychotic. Extensive counseling provided today regarding sleep hygiene. Will reassess in 2 months, consider obtaining GAD/PHQ at that time.

## 2020-06-09 NOTE — Progress Notes (Signed)
Virtual Visit via Video Note  I connected with Brooke [name] on 06/09/20 at  3:40 PM EST by a video enabled telemedicine application and verified that I am speaking with the correct person using two identifiers.  Location: Patient: home Provider: [facility]   I discussed the limitations of evaluation and management by telemedicine and the availability of in person appointments. The patient expressed understanding and agreed to proceed.  History of Present Illness:  MIGRAINES - Meds: aimovig 140mg , topiramate 100mg  BID, gabapentin 300mg  TID prn, maxalt prn - headaches better - haven't had aimovig in a while. Lower dose didn't work as well, higher dose worked well initially but hasn't been able to pick up refills.  - having headaches once or twice per week, severity improved.  - using maxalt only when bad, not very often.  - taking tylenol, naproxen as needed for moderate headaches 2-3x per week.  - taking gabapentin once per day at night.  - triggers: weather changes, neck tension, stress.  Location: both sides, can radiate to other side.  Headache duration: 1-2 days Radiation: yes Alleviating factors: sleeping, avoiding loud areas Aggravating factors: loud noises, bright lights Headache status at time of visit: asymptomatic Treatments attempted: Treatments attempted: rest, APAP, ibuprofen, triptans and topamax   Aura: no Nausea:  yes Vomiting: no Photophobia:  yes Phonophobia:  yes Effect on social functioning:  no Numbers of missed days of school/work each month: none Confusion:  no Gait disturbance/ataxia:  no Behavioral changes:  no Fevers:  no   INSOMNIA Duration: chronic Satisfied with sleep quality: no Difficulty falling asleep: yes Difficulty staying asleep: yes Daytime hypersomnolence: no Good sleep hygiene: no Apnea: no Snoring: no Recent stress: yes Restless legs/nocturnal leg cramps: no Chronic pain/arthritis: no History of sleep study: no Treatments  attempted: melatonin  On seroquel for about a year. Was taking 2 100mg  tablets with good effect but since changing to 200mg  tablets, sleep is worse.  Working more, harder to sleep. Doesn't have bedtime routine.      Observations/Objective:  Patient had trouble connecting to video visit, entirety of visit conducted over the phone.  Speaks in full sentences, no respiratory distress.  Assessment and Plan:  Migraine Doing well on current regimen with decreased severity. Counseled on judicious use of abortive therapy to avoid medication overuse headaches. Can consider restarting aimovig in the future if headaches become worse. No changes to regimen today, f/u in 2-3 months.  Insomnia Will switch back to 100mg  tablets of seroquel with aim to wean down dose over time given no other indication for antipsychotic. Extensive counseling provided today regarding sleep hygiene. Will reassess in 2 months, consider obtaining GAD/PHQ at that time.    I discussed the assessment and treatment plan with the patient. The patient was provided an opportunity to ask questions and all were answered. The patient agreed with the plan and demonstrated an understanding of the instructions.   The patient was advised to call back or seek an in-person evaluation if the symptoms worsen or if the condition fails to improve as anticipated.  I provided 21 minutes of non-face-to-face time during this encounter.   , DO

## 2020-06-10 ENCOUNTER — Ambulatory Visit: Admitting: Nurse Practitioner

## 2020-08-08 IMAGING — MR MR LUMBAR SPINE W/O CM
5 series · 31 of 48 positions shown · non-contrast
Comparison: Report of MRI lumbar spine 10/26/2015.

CLINICAL DATA: Chronic bilateral low back pain. Bilateral sciatica.

EXAM:
MRI LUMBAR SPINE WITHOUT CONTRAST
TECHNIQUE: Multiplanar, multisequence MR imaging of the lumbar spine was
performed. No intravenous contrast was administered.

[Series 5: T2 · sagittal · 4.0mm · 0.81mm/px · 6 of 17 slices shown (1 of 2)]
[im 1/17]
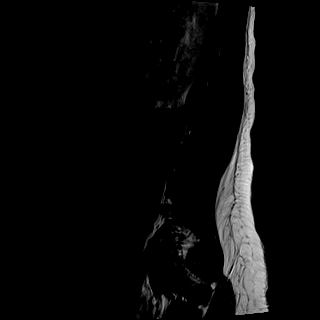
[im 4/17]
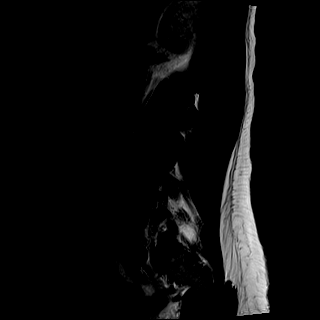
[im 7/17]
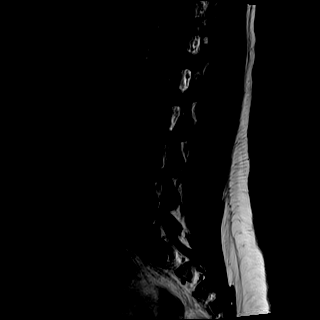
[im 10/17]
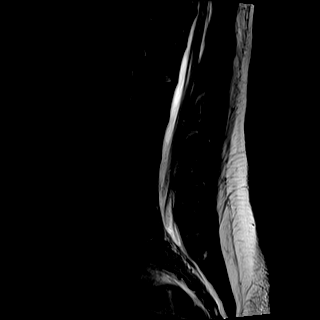
[im 13/17]
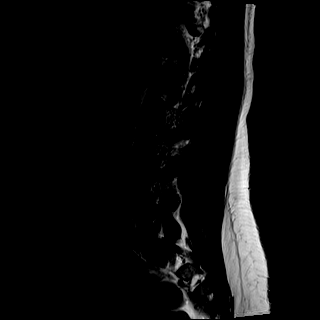
[im 17/17]
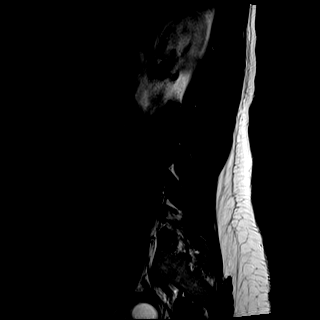

[Series 6: T1 · sagittal · 4.0mm · 0.81mm/px · 7 of 17 slices shown (1 of 2)]
[im 1/17]
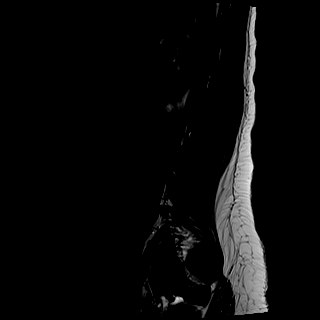
[im 3/17]
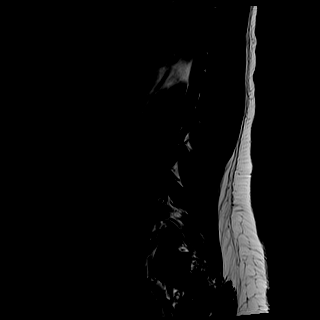
[im 6/17]
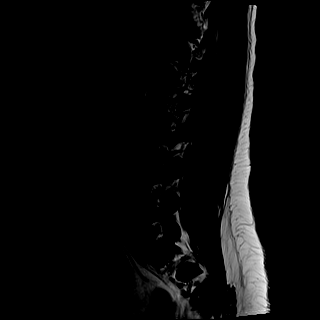
[im 9/17]
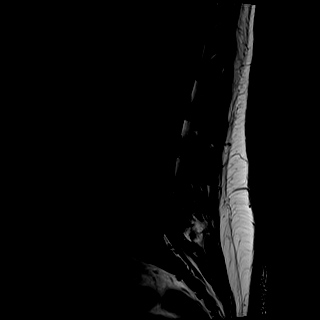
[im 11/17]
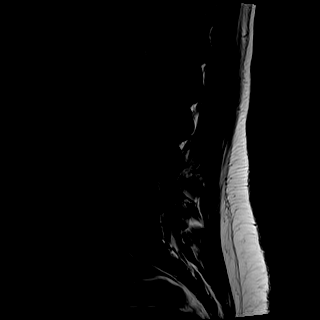
[im 14/17]
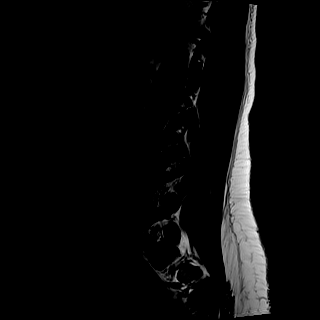
[im 17/17]
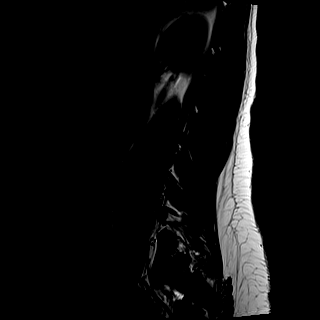

[Series 7: STIR · sagittal · 4.0mm · 0.41mm/px · 2 of 17 slices shown]
[im 1/17]
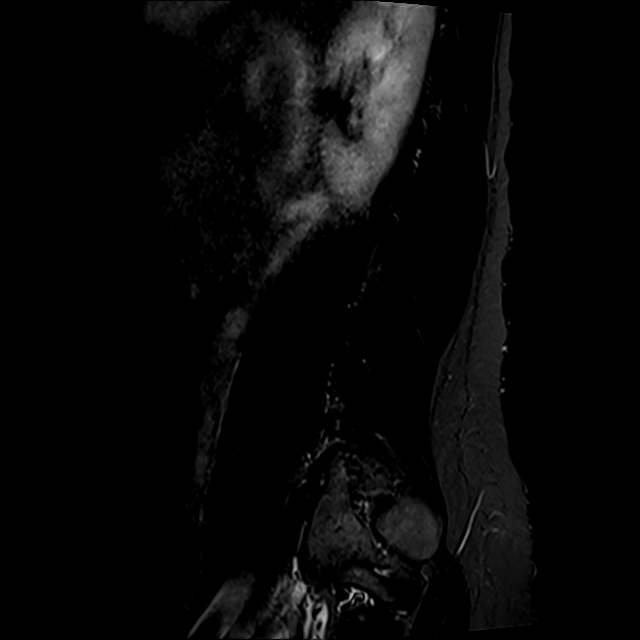
[im 3/17]
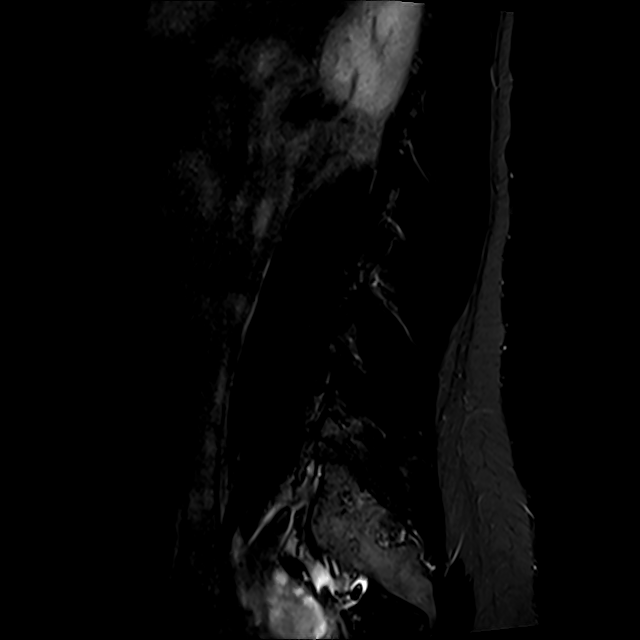

[Series 8: T2 · axial · 4.0mm · 0.78mm/px · z∈[-109,+89]mm · 8 of 34 slices shown (2 of 2)]
[im 1/34]
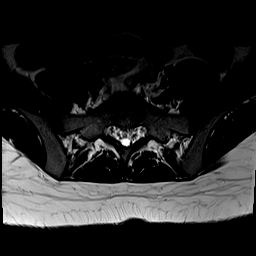
[im 6/34]
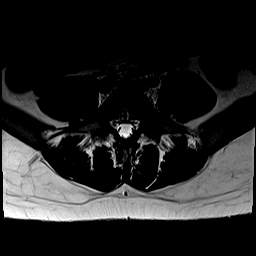
[im 11/34]
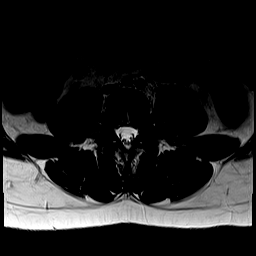
[im 16/34]
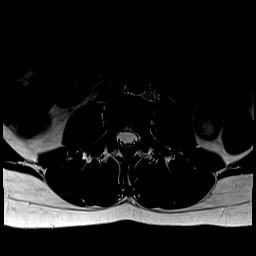
[im 18/34]
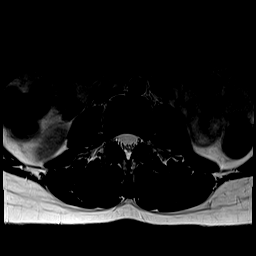
[im 23/34]
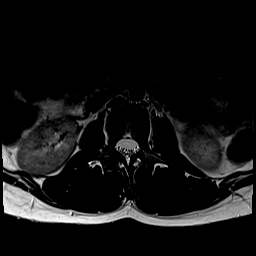
[im 28/34]
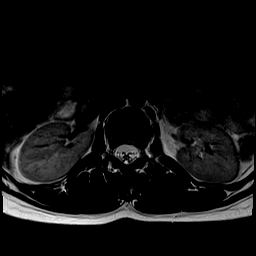
[im 34/34]
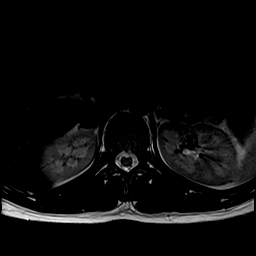

[Series 9: T1 · axial · 4.0mm · 0.39mm/px · z∈[-109,+89]mm · 8 of 34 slices shown (2 of 2)]
[im 1/34]
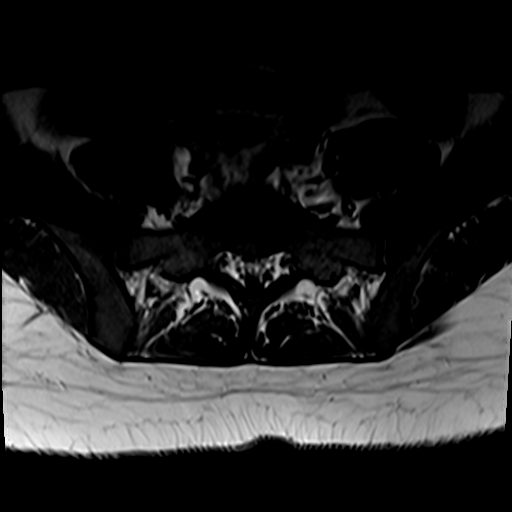
[im 6/34]
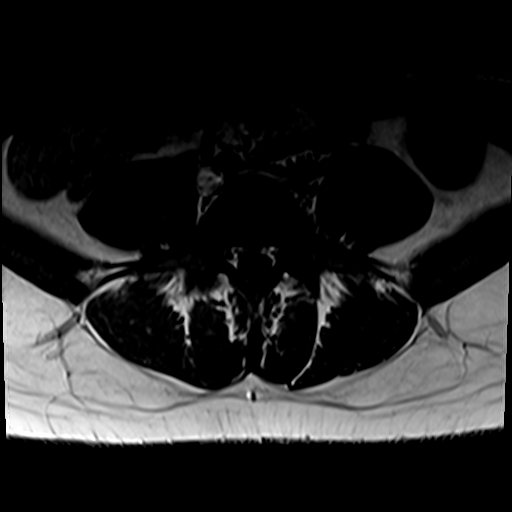
[im 11/34]
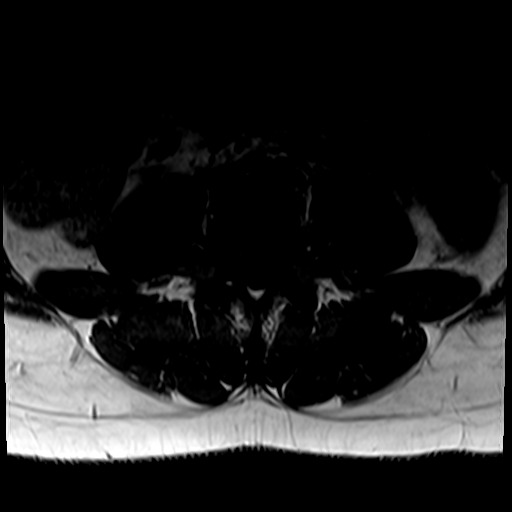
[im 16/34]
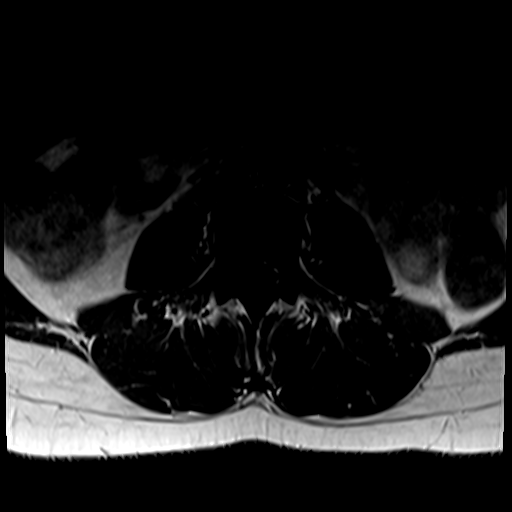
[im 18/34]
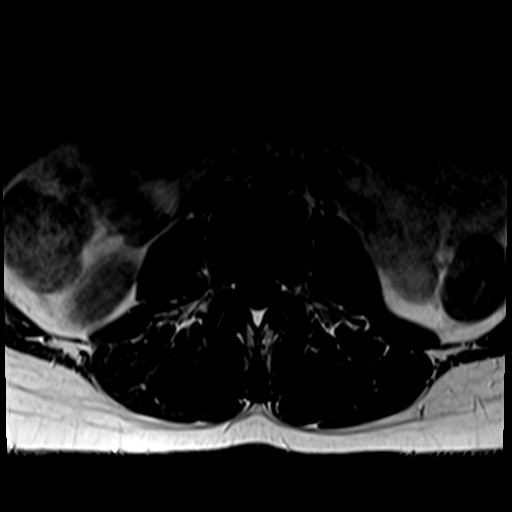
[im 23/34]
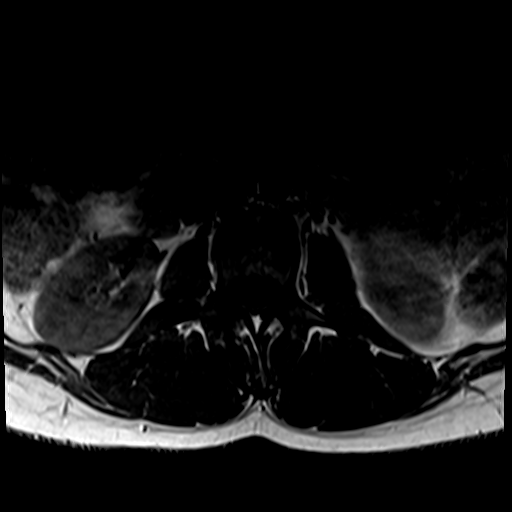
[im 28/34]
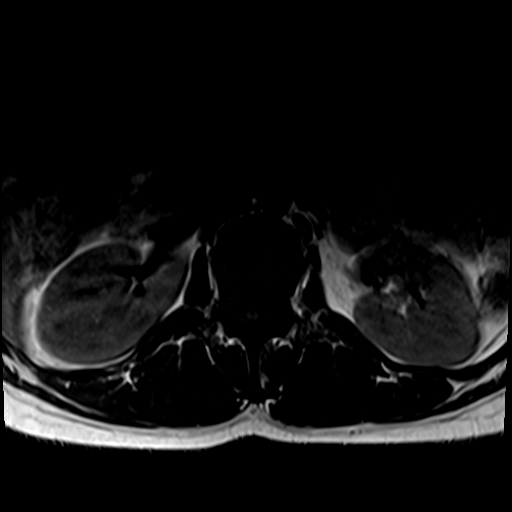
[im 34/34]
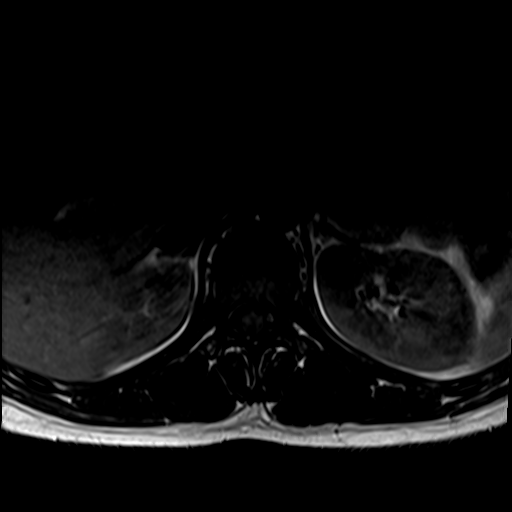

[31 of 48 positions shown; findings below may reference images not displayed]

FINDINGS: Segmentation: 5 non rib-bearing lumbar type vertebral bodies are
present. The lowest fully formed vertebral body is L5.

Alignment: Lumbar lordosis present. No significant listhesis is
present.

Vertebrae:  Marrow signal and vertebral body heights are normal.

Conus medullaris and cauda equina: Conus extends to the L1-2 level.
Conus and cauda equina appear normal.

Paraspinal and other soft tissues: Limited imaging the abdomen is
unremarkable. There is no significant adenopathy. No solid organ
lesions are present.

Disc levels:

L1-2: Negative.

L2-3: Negative.

L3-4: Negative.

L4-5: Negative.

L5-S1: Negative.
IMPRESSION: Negative MRI of the lumbar spine. No acute or focal lesion to
explain low back pain or sciatica.

## 2020-09-06 ENCOUNTER — Other Ambulatory Visit: Payer: Self-pay | Admitting: Nurse Practitioner

## 2020-09-06 NOTE — Telephone Encounter (Signed)
Requested medication (s) are due for refill today: yes  Requested medication (s) are on the active medication list: yes  Last refill:  06/08/20  Future visit scheduled: no  Notes to clinic:  med not delegated to NT to RF, overdue lab work   Requested Prescriptions  Pending Prescriptions Disp Refills   topiramate (TOPAMAX) 100 MG tablet [Pharmacy Med Name: TOPIRAMATE 100MG  TABLETS] 180 tablet 0    Sig: TAKE 1 TABLET(100 MG) BY MOUTH TWICE DAILY      Not Delegated - Neurology: Anticonvulsants - topiramate & zonisamide Failed - 09/06/2020  3:19 AM      Failed - This refill cannot be delegated      Failed - Cr in normal range and within 360 days    No results found for: CREATININE, LABCREAU, LABCREA, POCCRE        Failed - CO2 in normal range and within 360 days    No results found for: POCCO2, CO2, HCO3        Passed - Valid encounter within last 12 months    Recent Outpatient Visits           2 months ago Migraine without status migrainosus, not intractable, unspecified migraine type   [facility], DO   3 months ago COVID-19   [facility], DO   4 months ago Migraine with aura and with status migrainosus, not intractable   [facility], NP   10 months ago Chronic neck pain   [facility] [place]   10 months ago Acute midline thoracic back pain   [facility], NP

## 2020-09-07 ENCOUNTER — Telehealth: Payer: Self-pay

## 2020-09-07 ENCOUNTER — Other Ambulatory Visit: Payer: Self-pay | Admitting: Nurse Practitioner

## 2020-09-07 NOTE — Telephone Encounter (Signed)
Requested medications are due for refill today.  no  Requested medications are on the active medications list.  yes  Last refill. 09/07/2020  Future visit scheduled.   yes  Notes to clinic.  Medication not delegated.

## 2020-09-07 NOTE — Telephone Encounter (Signed)
Pt scheduled for 09/09/2020 

## 2020-09-07 NOTE — Telephone Encounter (Signed)
Patient is due for follow up.  30 day supply sent for patient to make appointment.

## 2020-09-07 NOTE — Telephone Encounter (Signed)
Pt scheduled 4/6

## 2020-09-07 NOTE — Telephone Encounter (Signed)
erroneous error  

## 2020-09-08 NOTE — Progress Notes (Deleted)
   There were no vitals taken for this visit.   Subjective:    Patient ID: Brooke [name], female    DOB: 11-18-1995, 25 y.o.   MRN: [id number]  HPI: Brooke [name] is a 25 y.o. female  No chief complaint on file.  MIGRAINES Duration: {Blank single:19197::"chronic","days","weeks","months","years"} Onset: {Blank single:19197::"sudden","gradual"} Severity: {Blank single:19197::"mild","moderate","severe","1/10","2/10","3/10","4/10","5/10","6/10","7/10","8/10","9/10","10/10"} Quality: {Blank multiple:19196::"sharp","dull","aching","burning","cramping","ill-defined","itchy","pressure-like","pulling","shooting","sore","stabbing","tender","tearing","throbbing"} Frequency: {Blank single:19197::"constant","intermittent","occasional","rare","every few minutes","a few times a hour","a few times a day","a few times a week","a few times a month","a few times a year"} Location:  Headache duration: Radiation: {Blank single:19197::"yes","no"} Time of day headache occurs:  Alleviating factors:  Aggravating factors:  Headache status at time of visit: {Blank single:19197::"current headache","asymptomatic"} Treatments attempted: Treatments attempted: {Blank multiple:19196::"none","rest","ice","heat","APAP","ibuprofen","aleve", excedrine","triptans","propranolol","topamax","amitriptyline"}   Aura: {Blank single:19197::"yes","no"} Nausea:  {Blank single:19197::"yes","no"} Vomiting: {Blank single:19197::"yes","no"} Photophobia:  {Blank single:19197::"yes","no"} Phonophobia:  {Blank single:19197::"yes","no"} Effect on social functioning:  {Blank single:19197::"yes","no"} Numbers of missed days of school/work each month:  Confusion:  {Blank single:19197::"yes","no"} Gait disturbance/ataxia:  {Blank single:19197::"yes","no"} Behavioral changes:  {Blank single:19197::"yes","no"} Fevers:  {Blank single:19197::"yes","no"}  Relevant past medical, surgical, family and social history reviewed and updated as  indicated. Interim medical history since our last visit reviewed. Allergies and medications reviewed and updated.  Review of Systems  Per HPI unless specifically indicated above     Objective:    There were no vitals taken for this visit.  Wt Readings from Last 3 Encounters:  06/09/20 155 lb (70.3 kg)  04/23/20 150 lb (68 kg)  10/24/19 145 lb (65.8 kg)    Physical Exam  Results for orders placed or performed in visit on 09/12/19  Novel Coronavirus, NAA ([facility])   Specimen: Nasopharyngeal(NP) swabs in vial transport medium   NASOPHARYNGE  TESTING  Result Value Ref Range   SARS-CoV-2, NAA Not Detected Not Detected  SARS-COV-2, NAA 2 DAY TAT   NASOPHARYNGE  TESTING  Result Value Ref Range   SARS-CoV-2, NAA 2 DAY TAT Performed       Assessment & Plan:   Problem List Items Addressed This Visit   None      Follow up plan: No follow-ups on file.

## 2020-09-09 ENCOUNTER — Ambulatory Visit: Admitting: Nurse Practitioner

## 2020-09-09 DIAGNOSIS — G43909 Migraine, unspecified, not intractable, without status migrainosus: Secondary | ICD-10-CM

## 2020-10-05 ENCOUNTER — Ambulatory Visit: Payer: Medicaid Other | Admitting: Nurse Practitioner

## 2020-10-05 ENCOUNTER — Ambulatory Visit: Payer: Self-pay | Admitting: *Deleted

## 2020-10-05 DIAGNOSIS — S0990XA Unspecified injury of head, initial encounter: Secondary | ICD-10-CM | POA: Diagnosis not present

## 2020-10-05 DIAGNOSIS — R110 Nausea: Secondary | ICD-10-CM | POA: Diagnosis not present

## 2020-10-05 DIAGNOSIS — R42 Dizziness and giddiness: Secondary | ICD-10-CM | POA: Diagnosis not present

## 2020-10-05 DIAGNOSIS — H53149 Visual discomfort, unspecified: Secondary | ICD-10-CM | POA: Diagnosis not present

## 2020-10-05 NOTE — Progress Notes (Deleted)
   There were no vitals taken for this visit.   Subjective:    Patient ID: Brooke [name], female    DOB: March 28, 1996, 25 y.o.   MRN: [id number]  HPI: Brooke [name] is a 25 y.o. female  No chief complaint on file.  HEADACHES Duration: {Blank single:19197::"chronic","days","weeks","months","years"} Onset: {Blank single:19197::"sudden","gradual"} Severity: {Blank single:19197::"mild","moderate","severe","1/10","2/10","3/10","4/10","5/10","6/10","7/10","8/10","9/10","10/10"} Quality: {Blank multiple:19196::"sharp","dull","aching","burning","cramping","ill-defined","itchy","pressure-like","pulling","shooting","sore","stabbing","tender","tearing","throbbing"} Frequency: {Blank single:19197::"constant","intermittent","occasional","rare","every few minutes","a few times a hour","a few times a day","a few times a week","a few times a month","a few times a year"} Location:  Headache duration: Radiation: {Blank single:19197::"yes","no"} Time of day headache occurs:  Alleviating factors:  Aggravating factors:  Headache status at time of visit: {Blank single:19197::"current headache","asymptomatic"} Treatments attempted: Treatments attempted: {Blank multiple:19196::"none","rest","ice","heat","APAP","ibuprofen","aleve", excedrine","triptans","propranolol","topamax","amitriptyline"}   Aura: {Blank single:19197::"yes","no"} Nausea:  {Blank single:19197::"yes","no"} Vomiting: {Blank single:19197::"yes","no"} Photophobia:  {Blank single:19197::"yes","no"} Phonophobia:  {Blank single:19197::"yes","no"} Effect on social functioning:  {Blank single:19197::"yes","no"} Numbers of missed days of school/work each month:  Confusion:  {Blank single:19197::"yes","no"} Gait disturbance/ataxia:  {Blank single:19197::"yes","no"} Behavioral changes:  {Blank single:19197::"yes","no"} Fevers:  {Blank single:19197::"yes","no"}   Relevant past medical, surgical, family and social history reviewed and updated as  indicated. Interim medical history since our last visit reviewed. Allergies and medications reviewed and updated.  Review of Systems  Per HPI unless specifically indicated above     Objective:    There were no vitals taken for this visit.  Wt Readings from Last 3 Encounters:  06/09/20 155 lb (70.3 kg)  04/23/20 150 lb (68 kg)  10/24/19 145 lb (65.8 kg)    Physical Exam  Results for orders placed or performed in visit on 09/12/19  Novel Coronavirus, NAA ([facility])   Specimen: Nasopharyngeal(NP) swabs in vial transport medium   NASOPHARYNGE  TESTING  Result Value Ref Range   SARS-CoV-2, NAA Not Detected Not Detected  SARS-COV-2, NAA 2 DAY TAT   NASOPHARYNGE  TESTING  Result Value Ref Range   SARS-CoV-2, NAA 2 DAY TAT Performed       Assessment & Plan:   Problem List Items Addressed This Visit   None      Follow up plan: No follow-ups on file.

## 2020-10-05 NOTE — Telephone Encounter (Signed)
Pt called in c/o a bad headache, nausea and very tired since a week ago Saturday.  She works in a kennel.   She was bent over picking up a dog bowel when a large black lab jumped and hit her in the left back of her head while bent over.   She did not lose consciousness but did see stars after it happened and had a headache.  See triage notes.  I have referred her to the ED per the protocol.  She was agreeable to going and is going now.  I sent my notes to [facility] so Larae Grooms, NP her provider would be aware.   Reason for Disposition . [1] SEVERE headache AND [2] not improved 2 hours after pain medicine/ice packs    History of concussions and this feels the same after being hit by a dog in the left back of her head.  (Works in a kennel)  Answer Assessment - Initial Assessment Questions 1. MECHANISM: "How did the injury happen?" For falls, ask: "What height did you fall from?" and "What surface did you fall against?"     I work at a kennel.  Last Sat. I was hit on left side back of head.   I saw stars after I was hit.   I just brushed it off.   That night I didn't feel good.   The next day I woke up with a bad migraine.   I've not been able to get rid this migraine.  My migraine medications are not working at all.   This is very usual for me.   I'm dizzy and nauseas.   I feel like I'm reeling.   I don't feel well.  2. ONSET: "When did the injury happen?" (Minutes or hours ago)      I've had concussions before.   This feels the same.   I've had Covid and my employer did not care.   I waited so long to be seen because my employer doesn't care.   3. NEUROLOGIC SYMPTOMS: "Was there any loss of consciousness?" "Are there any other neurological symptoms?"      Dizziness, headache, reeling, eye strain, I just don't feel good.   I've had concussions before and this feels the same.    A dog a big lab jumped out of no where when I bent over to pick up his bowel and he hit me in the  head.  I did not lose consciousness but I don't really know what happened being it happened so fast.   This dog just came out of no where.   I finished working and went home and went straight to bed. I stayed at work after it happened because my employer doesn't care they just want the job done.   "They didn't even care when I had covid".  "That's the reason I have waited so long just to be sure if it was serious or not".  "I know I should have been seen sooner but I just wanted to be sure there was nothing more I could do with my migraine medications".  "They haven't touched my headache which is very unusual for me".   "I rarely have to take anything for migraines".   "They don't happen that often and when they do my migraine medication takes care of it". 4. MENTAL STATUS: "Does the person know who he is, who you are, and where he is?"      I'm very tired.  Alert and oriented, appropriate conversation. 5. LOCATION: "What part of the head was hit?"      Left back of head.   I was bent over picking up his bowel when he jumped out of no where onto my head. 6. SCALP APPEARANCE: "What does the scalp look like? Is it bleeding now?" If Yes, ask: "Is it difficult to stop?"      No bleeding or cuts. 7. SIZE: For cuts, bruises, or swelling, ask: "How large is it?" (e.g., inches or centimeters)      Not that I know of 8. PAIN: "Is there any pain?" If Yes, ask: "How bad is it?"  (e.g., Scale 1-10; or mild, moderate, severe)     Yes.   The headache has been non stop since it happened and the pain is from the left back of my head where the dog hit me. 9. TETANUS: For any breaks in the skin, ask: "When was the last tetanus booster?"     Not asked 10. OTHER SYMPTOMS: "Do you have any other symptoms?" (e.g., neck pain, vomiting)       Nausea, visual changes, tired, constant headache, dizziness, reeling feeling on and off.   "I just don't feel good". 11. PREGNANCY: "Is there any chance you are pregnant?" "When was  your last menstrual period?"       Not asked  Protocols used: HEAD INJURY-A-AH

## 2020-10-06 ENCOUNTER — Telehealth: Payer: Self-pay | Admitting: *Deleted

## 2020-10-06 ENCOUNTER — Ambulatory Visit (INDEPENDENT_AMBULATORY_CARE_PROVIDER_SITE_OTHER): Admitting: Nurse Practitioner

## 2020-10-06 ENCOUNTER — Other Ambulatory Visit: Payer: Self-pay

## 2020-10-06 ENCOUNTER — Encounter: Payer: Self-pay | Admitting: Nurse Practitioner

## 2020-10-06 ENCOUNTER — Ambulatory Visit: Payer: Medicaid Other | Admitting: Nurse Practitioner

## 2020-10-06 VITALS — BP 127/80 | HR 103 | Temp 97.9°F | Wt 151.0 lb

## 2020-10-06 DIAGNOSIS — S060X0D Concussion without loss of consciousness, subsequent encounter: Secondary | ICD-10-CM | POA: Diagnosis not present

## 2020-10-06 DIAGNOSIS — F331 Major depressive disorder, recurrent, moderate: Secondary | ICD-10-CM | POA: Diagnosis not present

## 2020-10-06 DIAGNOSIS — G43101 Migraine with aura, not intractable, with status migrainosus: Secondary | ICD-10-CM | POA: Diagnosis not present

## 2020-10-06 MED ORDER — RIZATRIPTAN BENZOATE 10 MG PO TABS: ORAL_TABLET | ORAL | 6 refills | Status: AC

## 2020-10-06 MED ORDER — QUETIAPINE FUMARATE 200 MG PO TABS: ORAL_TABLET | ORAL | 1 refills | Status: DC

## 2020-10-06 MED ORDER — PROMETHAZINE HCL 25 MG PO TABS: 25.0000 mg | ORAL_TABLET | Freq: Three times a day (TID) | ORAL | 0 refills | Status: DC | PRN

## 2020-10-06 MED ORDER — GABAPENTIN 300 MG PO CAPS: 300.0000 mg | ORAL_CAPSULE | Freq: Three times a day (TID) | ORAL | 2 refills | Status: AC | PRN

## 2020-10-06 NOTE — Telephone Encounter (Signed)
Transition Care Management Unsuccessful Follow-up Telephone Call  Date of discharge and from where:  10/05/2020 [facility] ED  Attempts:  1st Attempt  Reason for unsuccessful TCM follow-up call:  Left voice message

## 2020-10-06 NOTE — Assessment & Plan Note (Signed)
Patient is not currently on medication.  Would like to address this after the concussion symptoms have resolved.  Will return to clinic when she is ready for medication.  Denies SI.

## 2020-10-06 NOTE — Assessment & Plan Note (Signed)
Medication refill sent .

## 2020-10-06 NOTE — Progress Notes (Signed)
BP 127/80   Pulse (!) 103   Temp 97.9 F (36.6 C)   Wt 151 lb (68.5 kg)   SpO2 100%   BMI 28.53 kg/m    Subjective:    Patient ID: Brooke [name], female    DOB: Mar 26, 1996, 25 y.o.   MRN: [id number]  HPI: Brooke [name] is a 25 y.o. female  Chief Complaint  Patient presents with  . Head Injury    On the 23rd patient was hit in the back of the head by the head of a dog, when it happened she seen stars, Saturday and Sunday were very fuzzy. Woke upp Monday with a Migraine, had taken 3 migraine pains on Monday. Dizziness and nausea. Patient went to the ER CT scan was normal  . Insomnia  . Migraine   Patient states on the April 23rd she was hit in the back of the head by a dog.  Then she saw starts and was kind of fuzzy for a couple of days after.  Woke up Monday with 3 migraines.  Patient endorses nausea and dizziness. Patient was sent to the ER and had a CT scan which was negative.  She has been taking her Maxalt and Tylenol which hasn't helped at all.  Patient took 3 Maxalt on Monday which she has never done before.  Patient has a history of 4 other migraines.   Relevant past medical, surgical, family and social history reviewed and updated as indicated. Interim medical history since our last visit reviewed. Allergies and medications reviewed and updated.  Review of Systems  Eyes: Positive for photophobia.  Neurological: Positive for dizziness and headaches.  Psychiatric/Behavioral: Negative for suicidal ideas.    Per HPI unless specifically indicated above     Objective:    BP 127/80   Pulse (!) 103   Temp 97.9 F (36.6 C)   Wt 151 lb (68.5 kg)   SpO2 100%   BMI 28.53 kg/m   Wt Readings from Last 3 Encounters:  10/06/20 151 lb (68.5 kg)  06/09/20 155 lb (70.3 kg)  04/23/20 150 lb (68 kg)    Physical Exam Vitals and nursing note reviewed.  Constitutional:      General: She is not in acute distress.    Appearance: Normal appearance. She is normal weight. She  is not ill-appearing, toxic-appearing or diaphoretic.  HENT:     Head: Normocephalic.     Right Ear: External ear normal.     Left Ear: External ear normal.     Nose: Nose normal.     Mouth/Throat:     Mouth: Mucous membranes are moist.     Pharynx: Oropharynx is clear.  Eyes:     General:        Right eye: No discharge.        Left eye: No discharge.     Extraocular Movements: Extraocular movements intact.     Conjunctiva/sclera: Conjunctivae normal.     Pupils: Pupils are equal, round, and reactive to light.  Cardiovascular:     Rate and Rhythm: Normal rate and regular rhythm.     Heart sounds: No murmur heard.   Pulmonary:     Effort: Pulmonary effort is normal. No respiratory distress.     Breath sounds: Normal breath sounds. No wheezing or rales.  Musculoskeletal:     Cervical back: Normal range of motion and neck supple.  Skin:    General: Skin is warm and dry.     Capillary Refill: Capillary  refill takes less than 2 seconds.  Neurological:     General: No focal deficit present.     Mental Status: She is alert and oriented to person, place, and time. Mental status is at baseline.     Cranial Nerves: No cranial nerve deficit.     Motor: No weakness.  Psychiatric:        Mood and Affect: Mood is anxious.        Behavior: Behavior normal.        Thought Content: Thought content normal.        Judgment: Judgment normal.     Results for orders placed or performed in visit on 09/12/19  Novel Coronavirus, NAA ([facility])   Specimen: Nasopharyngeal(NP) swabs in vial transport medium   NASOPHARYNGE  TESTING  Result Value Ref Range   SARS-CoV-2, NAA Not Detected Not Detected  SARS-COV-2, NAA 2 DAY TAT   NASOPHARYNGE  TESTING  Result Value Ref Range   SARS-CoV-2, NAA 2 DAY TAT Performed       Assessment & Plan:   Problem List Items Addressed This Visit      Cardiovascular and Mediastinum   Migraine    Medication refill sent.       Relevant Medications    Erenumab-aooe (AIMOVIG) 140 MG/ML SOAJ   promethazine (PHENERGAN) 25 MG tablet   rizatriptan (MAXALT) 10 MG tablet   gabapentin (NEURONTIN) 300 MG capsule     Other   Moderate episode of recurrent major depressive disorder (HCC)    Patient is not currently on medication.  Would like to address this after the concussion symptoms have resolved.  Will return to clinic when she is ready for medication.  Denies SI.       Other Visit Diagnoses    Concussion without loss of consciousness, subsequent encounter    -  Primary   Due to patient's history of concussions and current medication regimen will refer to Neurology for further evaluation.   Relevant Orders   Ambulatory referral to Neurology       Follow up plan: Return if symptoms worsen or fail to improve.

## 2020-10-07 NOTE — Telephone Encounter (Signed)
Transition Care Management Unsuccessful Follow-up Telephone Call  Date of discharge and from where:  10/05/2020 [facility] ED  Attempts:  2nd Attempt  Reason for unsuccessful TCM follow-up call:  Left voice message

## 2020-10-08 NOTE — Telephone Encounter (Signed)
Transition Care Management Follow-up Telephone Call  Date of discharge and from where: 10/06/2020 from [facility]  How have you been since you were released from the hospital? Pt stated that she still has a migraine. Pt saw PCP on Tuesday and she has been referred to neurology. Gave patient number to neurologist.   Any questions or concerns? No  Items Reviewed:  Did the pt receive and understand the discharge instructions provided? Yes   Medications obtained and verified? Yes   Other? No   Any new allergies since your discharge? No   Dietary orders reviewed? n/a  Do you have support at home? Yes   Functional Questionnaire: (I = Independent and D = Dependent) ADLs: I  Bathing/Dressing- I  Meal Prep- I  Eating- I  Maintaining continence- I  Transferring/Ambulation- I  Managing Meds- I   Follow up appointments reviewed:   PCP Hospital f/u appt confirmed? No  .  Specialist Hospital f/u appt confirmed? No  pt referred to neurology  Are transportation arrangements needed? No   If their condition worsens, is the pt aware to call PCP or go to the Emergency Dept.? Yes  Was the patient provided with contact information for the PCP's office or ED? Yes  Was to pt encouraged to call back with questions or concerns? Yes

## 2020-10-22 NOTE — Progress Notes (Signed)
BP 127/84   Pulse (!) 111   Wt 150 lb (68 kg) Comment: Patient reported  SpO2 100%   BMI 28.34 kg/m    Subjective:    Patient ID: Brooke [name], female    DOB: [birth year], 25 y.o.   MRN: [id number]  HPI: Ileigh [name] is a 25 y.o. female  Chief Complaint  Patient presents with  . Medication Dose Change  . Other    Patient is having trouble speaking, she thinks that it is due to her recent concussion and the use of seroquel.  Patient had multiple falls since her last visit.  Has not been able to go to neurology yet, but it is scheduled for June 1 Patient states that her emotions are all over the place, getting angry often and quick, feels like she has different personalities, due to her emotions.    . Nausea    Has been having to take her phenergan more often, she has been having a lot more nausea  . Foot Pain    Bunions    Patient was here two weeks ago following a concussion.  Patient saw her Neurochiropractor states that he states this concussion isn't as bad as her last concussion.  Her balance was off per the neurochiropractor.  He did several balance tests which were all abnormal.  Since her last visit she has fallen a couple of times.  Patient has fallen and hit her head a couple of times. Patient's mom states that she is unaware of things being around her so she has hit her head on microwaves, cabinets and doors. Mom states patient is having tongue thrusting.   Patient's Mom states they are concerned about tardive dyskinesia.  She gets lost in her sentences, says the wrong word, and difficulty forming words sometimes.  Nausea has worsened.  She states she was smoking pot regularly but after her concussion she stopped.  Patient feels like her personality has changed.  She sometimes is talking with an accent. She reports that these are all new symptoms    Relevant past medical, surgical, family and social history reviewed and updated as indicated. Interim medical history  since our last visit reviewed. Allergies and medications reviewed and updated.  Review of Systems  Gastrointestinal: Positive for nausea.  Neurological: Positive for dizziness, speech difficulty, weakness and headaches.  Psychiatric/Behavioral: Positive for behavioral problems, confusion, decreased concentration and sleep disturbance.    Per HPI unless specifically indicated above     Objective:    BP 127/84   Pulse (!) 111   Wt 150 lb (68 kg) Comment: Patient reported  SpO2 100%   BMI 28.34 kg/m   Wt Readings from Last 3 Encounters:  10/23/20 150 lb (68 kg)  10/06/20 151 lb (68.5 kg)  06/09/20 155 lb (70.3 kg)    Physical Exam Vitals and nursing note reviewed.  Constitutional:      General: She is not in acute distress.    Appearance: Normal appearance. She is normal weight. She is not ill-appearing, toxic-appearing or diaphoretic.  HENT:     Head: Normocephalic.     Right Ear: External ear normal.     Left Ear: External ear normal.     Nose: Nose normal.     Mouth/Throat:     Mouth: Mucous membranes are moist.     Pharynx: Oropharynx is clear.  Eyes:     General:        Right eye: No discharge.  Left eye: No discharge.     Extraocular Movements: Extraocular movements intact.     Conjunctiva/sclera: Conjunctivae normal.     Pupils: Pupils are equal, round, and reactive to light.  Cardiovascular:     Rate and Rhythm: Normal rate and regular rhythm.     Heart sounds: No murmur heard.   Pulmonary:     Effort: Pulmonary effort is normal. No respiratory distress.     Breath sounds: Normal breath sounds. No wheezing or rales.  Musculoskeletal:     Cervical back: Normal range of motion and neck supple.  Skin:    General: Skin is warm and dry.     Capillary Refill: Capillary refill takes less than 2 seconds.  Neurological:     General: No focal deficit present.     Mental Status: She is alert and oriented to person, place, and time.     Motor: Weakness  present.     Coordination: Coordination abnormal.     Comments: Stuttered   Psychiatric:        Mood and Affect: Mood normal.        Behavior: Behavior normal.        Thought Content: Thought content normal.        Judgment: Judgment normal.     Results for orders placed or performed in visit on 09/12/19  Novel Coronavirus, NAA ([facility])   Specimen: Nasopharyngeal(NP) swabs in vial transport medium   NASOPHARYNGE  TESTING  Result Value Ref Range   SARS-CoV-2, NAA Not Detected Not Detected  SARS-COV-2, NAA 2 DAY TAT   NASOPHARYNGE  TESTING  Result Value Ref Range   SARS-CoV-2, NAA 2 DAY TAT Performed       Assessment & Plan:   Problem List Items Addressed This Visit   None   Visit Diagnoses    Abnormal neurological exam    -  Primary   Due to patient's symptoms and possibility of serotonin syndrome, stroke, and head injury she was referred to the ER for further evaluation.   Concussion without loss of consciousness, subsequent encounter       Symptoms have been worsening since her most recent concussion. Patient has hit her head several more times. Referred to the ER for further evaluation.        Follow up plan: Return if symptoms worsen or fail to improve.    A total of 40 minutes were spent on this encounter today.  When total time is documented, this includes both the face-to-face and non-face-to-face time personally spent before, during and after the visit on the date of the encounter.

## 2020-10-23 ENCOUNTER — Ambulatory Visit (INDEPENDENT_AMBULATORY_CARE_PROVIDER_SITE_OTHER): Admitting: Nurse Practitioner

## 2020-10-23 ENCOUNTER — Encounter: Payer: Self-pay | Admitting: Nurse Practitioner

## 2020-10-23 ENCOUNTER — Other Ambulatory Visit: Payer: Self-pay

## 2020-10-23 VITALS — BP 127/84 | HR 111 | Wt 150.0 lb

## 2020-10-23 DIAGNOSIS — R2990 Unspecified symptoms and signs involving the nervous system: Secondary | ICD-10-CM | POA: Diagnosis not present

## 2020-10-23 DIAGNOSIS — S060X0D Concussion without loss of consciousness, subsequent encounter: Secondary | ICD-10-CM

## 2020-10-26 ENCOUNTER — Telehealth: Payer: Self-pay

## 2020-10-26 NOTE — Telephone Encounter (Signed)
Transition Care Management Unsuccessful Follow-up Telephone Call  Date of discharge and from where:  10/23/2020 from [facility]   Attempts:  1st Attempt  Reason for unsuccessful TCM follow-up call:  Left voice message

## 2020-10-27 NOTE — Telephone Encounter (Signed)
Transition Care Management Unsuccessful Follow-up Telephone Call  Date of discharge and from where:  10/23/2020 from [facility]  Attempts:  2nd Attempt  Reason for unsuccessful TCM follow-up call:  Left voice message

## 2020-10-28 NOTE — Telephone Encounter (Signed)
Transition Care Management Unsuccessful Follow-up Telephone Call  Date of discharge and from where:  10/23/2020 from [facility]  Attempts:  3rd Attempt  Reason for unsuccessful TCM follow-up call:  Unable to reach patient

## 2020-11-23 ENCOUNTER — Other Ambulatory Visit: Payer: Self-pay | Admitting: Nurse Practitioner

## 2020-11-23 DIAGNOSIS — G43101 Migraine with aura, not intractable, with status migrainosus: Secondary | ICD-10-CM

## 2020-11-23 NOTE — Telephone Encounter (Signed)
Would pt need apt for this? 

## 2020-11-30 ENCOUNTER — Other Ambulatory Visit: Payer: Self-pay | Admitting: Nurse Practitioner

## 2020-11-30 NOTE — Telephone Encounter (Signed)
Would pt need apt for this? 

## 2020-12-15 ENCOUNTER — Other Ambulatory Visit: Payer: Self-pay | Admitting: Physician Assistant

## 2020-12-15 DIAGNOSIS — M4802 Spinal stenosis, cervical region: Secondary | ICD-10-CM

## 2020-12-18 ENCOUNTER — Other Ambulatory Visit: Payer: Self-pay

## 2020-12-18 ENCOUNTER — Ambulatory Visit
Admission: RE | Admit: 2020-12-18 | Discharge: 2020-12-18 | Disposition: A | Discharge status: Home / Self Care | Source: Ambulatory Visit | Attending: Physician Assistant | Admitting: Physician Assistant

## 2020-12-18 DIAGNOSIS — M4802 Spinal stenosis, cervical region: Secondary | ICD-10-CM | POA: Diagnosis not present

## 2020-12-22 ENCOUNTER — Other Ambulatory Visit: Payer: Self-pay

## 2020-12-22 ENCOUNTER — Ambulatory Visit: Attending: Neurology

## 2020-12-22 DIAGNOSIS — Z9181 History of falling: Secondary | ICD-10-CM | POA: Diagnosis present

## 2020-12-22 DIAGNOSIS — R2681 Unsteadiness on feet: Secondary | ICD-10-CM | POA: Diagnosis not present

## 2020-12-22 NOTE — Therapy (Signed)
[facility] [address] [facility] [address]. [place], [place], [address] Phone: [phone]   Fax:  [phone]  Physical Therapy Evaluation  Patient Details  Name: Brooke [name] MRN: [id number] Date of Birth: March 18, 1996 Referring Provider (PT): Theora Master  Encounter Date: 12/22/2020   PT End of Session - 12/23/20 1610     Visit Number 1    Number of Visits 25    Date for PT Re-Evaluation 03/16/21    Authorization Type eval: 12/23/20    PT Start Time 1400    PT Stop Time 1450    PT Time Calculation (min) 50 min    Activity Tolerance Patient tolerated treatment well    Behavior During Therapy [facility] for tasks assessed/performed             Past Medical History:  Diagnosis Date   Anxiety    Concussion    Depression    Dyschromia    Head ache    Migraine     Past Surgical History:  Procedure Laterality Date   Thumb Surgery, Left     TONSILLECTOMY AND ADENOIDECTOMY      There were no vitals filed for this visit.    Subjective Assessment - 12/23/20 1527     Subjective Unsteadiness    Pertinent History In April patient was hit in the back of her head by a dog. "Since then I have fallen and hit my head more than 20 times." She has had persistent issues with her balance, falls, nausea, and emotional lability/blunted responses since the injury. "My emotions have been out of wack since this concussion." Denies suicidal or homicidal ideation. She has noted some mild improvement in her balance since onset. Initially she was having some speech difficult which has since resolved. Her nausea has persisted with decreased food intake resulting in 10-20 pound weight loss. She has tried Zofran and Phenergan without help. She was evaluated by gastroenterology who ordered an upper endoscopy however provider felt confident that her symptoms were not GI related. Pt has a prior history of migraines (2x/wk) but since the concussion has been having daily  headaches. Headaches vary in location and present with light and sound sensitivity. Due to complaints of L peripheral visual deficit she was referred to neuro opthalmology who said that she had "perfect" peripheral vision" however she continues to struggle to see in her L peripheral visual field. She was initially seeing a "neuro chiropractor" after the injury but states that as her symptoms worsened they were adamant that she see a neurologist and have imaging. She is currently being followed by [facility] neurology and has an appointment at [facility] neurology August 2nd, 2022.  They have prescribed nortriptyline and recently the dose was increased. Pt denies any improvement since starting the medication but does complain of increased fatigue. She has had a head CT and brain MRI which were both normal. Previous lumbar MRI 04/30/2019 was WNL. She hasn't been driving or working since the concussion and is currently unable to go outside and play with her dogs due to fear of hitting her head. She often wears a helmet at home to protect her head due to fear of falling. She also complains of fatigue which is limiting her activity level, however, she has been on two hikes since her concussion which was encouraging. 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 She underwent LUE EMG  on 11/30/20 which showed no electrodiagnostic evidence of a large fiber neuropathy or myopathy in the left upper extremity. See additional history below    Diagnostic tests See history    Patient Stated Goals Improve balance and decrease falls    Currently in Pain? --   Unrelated to current evaluation, see history               [facility] PT Assessment - 12/23/20 1606       Assessment   Referring Provider (PT) Theora MasterPotter, Zachary    Onset Date/Surgical Date 09/04/20   Approximate   Next MD Visit 01/05/21 with [facility] neurology    Prior Therapy  Has seen "neuro chiropractor" but otherwise no PT since this recent concussion      Precautions   Precautions Fall      Restrictions   Weight Bearing Restrictions No      Balance Screen   Has the patient fallen in the past 6 months Yes    How many times? Approximately 15-20    Has the patient had a decrease in activity level because of a fear of falling?  Yes    Is the patient reluctant to leave their home because of a fear of falling?  Yes      Prior Function   Level of Independence Independent    Vocation Other (comment)   Not currently working   [employer] Previously working at [employer]    Leisure Hiking, previously played soccer      Cognition   Overall Cognitive Status Within Functional Limits for tasks assessed   Pt denies any difficulty processing since injury               VESTIBULAR AND BALANCE EVALUATION   HISTORY:  Subjective history of current problem:  In April patient was hit in the back of her head by a dog. "Since then I have fallen and hit my head more than 20 times." She has had persistent issues with her balance, falls, nausea, and emotional lability/blunted responses since the injury. "My emotions have been out of wack since this concussion." Denies suicidal or homicidal ideation. She has noted some mild improvement in her balance since onset. Initially she was having some speech difficult which has since resolved. Her nausea has persisted with decreased food intake resulting in 10-20 pound weight loss. She has tried Zofran and Phenergan without help. She was evaluated by gastroenterology who ordered an upper endoscopy however provider felt confident that her symptoms were not GI related. Pt has a prior history of migraines (2x/wk) but since the concussion has been having daily headaches. Headaches vary in location and present with light and sound sensitivity. Due to complaints of L peripheral visual deficit she was referred to neuro opthalmology  who said that she had "perfect" peripheral vision" however she continues to struggle to see in her L peripheral visual field. She was initially seeing a "neuro chiropractor" after the injury but states that as her symptoms worsened they were adamant that she see a neurologist and have imaging. [facility] and [facility].  They have prescribed nortriptyline and recently the dose was increased. Pt denies any improvement since starting the medication but does complain of increased fatigue. She has had a head CT and brain MRI which were both normal. Previous lumbar MRI 04/30/2019 was WNL. She hasn't been driving or working since the concussion and is  currently unable to go outside and play with her dogs due to fear of hitting her head. She often wears a helmet at home to protect her head due to fear of falling. She also complains of fatigue which is limiting her activity level, however, she has been on two hikes since her concussion which was encouraging. 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 She underwent LUE EMG on 11/30/20 which showed no electrodiagnostic evidence of a large fiber neuropathy or myopathy in the left upper extremity. She also had a cervical MRI on 12/18/20 which showed tiny left subarticular disc protrusion with annular fissure at C5-6, potentially affecting the ventral left C6 nerve root. Right paracentral disc protrusion at C3-4, potentially affecting the ventral right C4 nerve root. Small central disc protrusion at C6-7 without significant stenosis or impingement. Mild noncompressive disc bulging at C4-5 without stenosis. She was prescribed oral steroids which she recently finished but did not notice any improvement in her LUE symptoms. She has suffered at least five previous concussions while playing soccer.  Her last significant concussion was in 2018 which resulted in insomnia. She was prescribed Seroquel by her PCP to help with this issue which she continues to take. Pt reports that it took her over a year to recover from that concussion. She states that MD recently decreased her Seroquel dosage due to concerns over serotonin syndrome. She also complains that since her injury she bruises very easily. 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   Description of dizziness: unsteadiness, falling, vertigo  Progression of symptoms: possibly mildly improved History of similar episodes: Previous history of multiple concussion with some prolonged symptoms but this occurrence is much worse.   Falls (yes/no): Yes Number of falls in past 6 months: "15-20"   Prior Functional Level: Previously independent with ADLs/IADLs. Previously working at a [employer].   Auditory complaints: intermittent tinnitus bilaterally, denies aural fullness, ear pain, ear discharge, or changes in hearing.  Vision complaints: subjective diminished L peripheral vision. Denies diplopia. Wears glasses but not recently due to broken glasses;   Chest pain/palpitations: Denies Headaches/migraines: daily headaches with prior history of regular migraines;  Red Flags: Pt reports dysphagia when taking her medications (doesn't occur with food). Initially had dysarthria but has not resolved. Has lost about 10-20# since the onset of the symptoms. Denies bowel and bladder changes, chills, fever, or previous personal history of cancer.     EXAMINATION  POSTURE: No gross deficits noted  NEUROLOGICAL SCREEN: (2+ unless otherwise noted.) N=normal  Ab=abnormal  Level Dermatome R L Myotome R L Reflex R L  C3 Anterior Neck N Ab Sidebend C2-3 N N Jaw CN V    C4 Top of Shoulder N Ab Shoulder Shrug C4 N N Hoffman's UMN    C5 Lateral Upper Arm N N Shoulder ABD C4-5 N Ab Biceps C5-6    C6 Lateral Arm/ Thumb N Ab Arm Flex/ Wrist  Ext C5-6 N N Brachiorad. C5-6    C7 Middle Finger N Ab Arm Ext//Wrist Flex C6-7 N N Triceps C7    C8 4th & 5th Finger N N Flex/ Ext Carpi Ulnaris C8 N N Patellar (L3-4)    T1 Medial Arm N N Interossei T1 N N Gastrocnemius    L2 Medial thigh/groin N N Illiopsoas (L2-3) N N     L3 Lower thigh/med.knee N N Quadriceps (L3-4) N N  L4 Medial leg/lat thigh N Ab Tibialis Ant (L4-5) N Abn     L5 Lat. leg & dorsal foot N Ab EHL (L5) N Abn     S1 post/lat foot/thigh/leg N Ab Gastrocnemius (S1-2) N Abn     S2 Post./med. thigh & leg N N Hamstrings (L4-S3) N N      L shoulder shrug diminished;   CRANIAL NERVES Deferred  COORDINATION: Deferred  MUSCULOSKELETAL SCREEN: Cervical Spine ROM: WFL and painless in all planes. No gross deficits identified   ROM: No gross deficits in UE/LE AROM;   MMT: 4+/5 R shoulder flexion and abduction, decreased L grip strength (not formally measured today)  Functional Mobility: Ambulates without assistive device;     POSTURAL CONTROL TESTS:   Clinical Test of Sensory Interaction for Balance (CTSIB):  CONDITION TIME SWAY  Eyes open, firm surface 30 seconds 1+  Eyes closed, firm surface 3 seconds 4+  Eyes open, foam surface 3-4 seconds 4+  Eyes closed, foam surface Test discontinued after condition 3 4+    OCULOMOTOR / VESTIBULAR TESTING:  Oculomotor Exam- Room Light  Findings Comments  Ocular Alignment normal   Ocular ROM normal   Spontaneous Nystagmus normal   Gaze-Holding Nystagmus normal   End-Gaze Nystagmus normal   Convergence (normal 2-3") abnormal 6"  Smooth Pursuit normal   Cross-Cover Test normal   Saccades abnormal Normal horizontally, slow and hypometric vertically;   VOR Cancellation abnormal Nausea and blurring, no saccades noted  Left Head Impulse abnormal Consistent corrective saccade  Right Head Impulse abnormal Consistent corrective saccade  Static Acuity not examined   Dynamic Acuity not examined     Oculomotor Exam-  Fixation Suppressed: Deferred   BPPV TESTS: Deferred   FUNCTIONAL OUTCOME MEASURES   Results Comments  BERG NT   FGA NT   ABC Scale NT   FOTO 53 Predicted improvement to 60             Objective measurements completed on examination: See above findings.               PT Education - 12/23/20 1610     Education Details Plan of care    Person(s) Educated Patient;Other (comment)   girlfriend   Methods Explanation    Comprehension Verbalized understanding              PT Short Term Goals - 12/23/20 1615       PT SHORT TERM GOAL #1   Title Pt will be independent with HEP in order to improve strength and balance in order to decrease fall risk and improve function at home and work.    Time 6    Period Weeks    Status New    Target Date 02/02/21               PT Long Term Goals - 12/23/20 1616       PT LONG TERM GOAL #1   Title Pt will improve BERG by at least 3 points in order to demonstrate clinically significant improvement in balance.    Baseline 12/22/20: To be completed at next session    Time 12    Period Weeks    Status New    Target Date 03/16/21      PT LONG TERM GOAL #2   Title Pt will improve FGA by at least 4 points in order to demonstrate clinically significant improvement in balance and decreased risk for falls.    Baseline 12/22/20: To  be completed at next session    Time 12    Period Weeks    Status New    Target Date 03/16/21      PT LONG TERM GOAL #3   Title Pt will improve ABC by at least 13% in order to demonstrate clinically significant improvement in balance confidence    Baseline 12/22/20: To be completed at next session    Time 12    Period Weeks    Status New    Target Date 03/16/21      PT LONG TERM GOAL #4   Title Pt will improve FOTO to at least 60 in order to demonstrate significant improvement in function related to balance deficits    Baseline 12/22/20: 53    Time 12    Period Weeks    Status New     Target Date 03/16/21                    Plan - 12/23/20 1611     Clinical Impression Statement Pt is a pleasant 25 year-old female referred for difficulty with balance s/p concussion. She has a very complex history so limited examination performed today. She presents with some weakness in L shoulder flexion and abduction as well as L grip strength as well as diminished sensation in C6-7 dermatomes. She also has weakness in L ankle dorsiflexion and plantarflexion with decreased sensation in L4-S1 dermatomes. Oculomotor/vestibular testing is abnormal for convergence distance. She also has slow and hypometric vertical saccade testing, nausea/blurring with VOR cancellation, and consistent corrective saccades with bilateral head impulse testing. If more objective data on vestibular function is needed would consider VNG study with ENT. During modified CTSIB pt loses balance in 3 seconds during condition 2 (feet together eyes closed). Will perform additional fixation suppression testing at first follow-up visit in addition to more in depth balance and coordination tests.  Pt presents with deficits in strength, gait and balance. She will benefit from skilled PT services to address these deficits in order to improve her function at home, return to work, and decrease her risk for future falls.    Personal Factors and Comorbidities Comorbidity 3+    Comorbidities Amplified musculoskeletal pain syndrome, insomnia, depression, multiple concussion, migraines    Examination-Activity Limitations Caring for Others;Locomotion Level;Squat;Stand    Examination-Participation Restrictions Community Activity;Occupation;Other   Leisure activities (hiking)   Stability/Clinical Decision Making Unstable/Unpredictable    Clinical Decision Making High    Rehab Potential Good    PT Frequency 2x / week    PT Duration 12 weeks    PT Treatment/Interventions ADLs/Self Care Home Management;Aquatic  Therapy;Biofeedback;Canalith Repostioning;Cryotherapy;Electrical Stimulation;Iontophoresis 4mg /ml Dexamethasone;Moist Heat;Traction;Ultrasound;DME Instruction;Gait training;Stair training;Functional mobility training;Therapeutic activities;Therapeutic exercise;Balance training;Neuromuscular re-education;Cognitive remediation;Patient/family education;Manual techniques;Passive range of motion;Dry needling;Vestibular;Visual/perceptual remediation/compensation;Spinal Manipulations;Joint Manipulations    PT Next Visit Plan Pt to complete ABC, fixation suppression testing, BPPV testing, cranial nerve screen, coordination testing, BERG, DGI/FGA, initiate balance/strength exercises, prescribe HEP    PT Home Exercise Plan None currently    Consulted and Agree with Plan of Care Patient;Other (Comment)   Girlfriend            Patient will benefit from skilled therapeutic intervention in order to improve the following deficits and impairments:  Decreased balance, Decreased activity tolerance, Other (comment) (Frequent falls)  Visit Diagnosis: Unsteadiness on feet  History of falling     Problem List Patient Active Problem List   Diagnosis Date Noted   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 [place], [place], [address] Phone: [phone]   Fax:  [phone]  Name: Brooke [name] MRN: [id number] Date of Birth: 10-17-1995

## 2020-12-23 DIAGNOSIS — R200 Anesthesia of skin: Secondary | ICD-10-CM | POA: Insufficient documentation

## 2020-12-23 DIAGNOSIS — R531 Weakness: Secondary | ICD-10-CM | POA: Insufficient documentation

## 2020-12-28 ENCOUNTER — Ambulatory Visit

## 2020-12-28 ENCOUNTER — Other Ambulatory Visit: Payer: Self-pay

## 2020-12-28 DIAGNOSIS — R2681 Unsteadiness on feet: Secondary | ICD-10-CM

## 2020-12-28 DIAGNOSIS — Z9181 History of falling: Secondary | ICD-10-CM

## 2020-12-28 NOTE — Therapy (Signed)
[facility] [address] [facility] [address] [place], [place], [address] Phone: [phone]   Fax:  [phone]  Physical Therapy Treatment  Patient Details  Name: Brooke [name] MRN: [id number] Date of Birth: 1996/05/28 Referring Provider (PT): Theora Master   Encounter Date: 12/28/2020   PT End of Session - 12/28/20 0757     Visit Number 2    Number of Visits 25    Date for PT Re-Evaluation 03/16/21    Authorization Type eval: 12/23/20    PT Start Time 0800    PT Stop Time 0845    PT Time Calculation (min) 45 min    Activity Tolerance Patient tolerated treatment well    Behavior During Therapy [facility] for tasks assessed/performed             Past Medical History:  Diagnosis Date   Anxiety    Concussion    Depression    Dyschromia    Head ache    Migraine     Past Surgical History:  Procedure Laterality Date   Thumb Surgery, Left     TONSILLECTOMY AND ADENOIDECTOMY      There were no vitals filed for this visit.   Subjective Assessment - 12/28/20 0757     Subjective Patient reports she is doing all right upon arrival today.  She reports persistent symptoms into the left upper extremity and states she was referred to physiatry for consultation regarding her MRI findings.  She also complains of difficulty with head turns while in car causing disorientation.  Patient is accompanied by mother during session today.    Pertinent History In April patient was hit in the back of her head by a dog. "Since then I have fallen and hit my head more than 20 times." She has had persistent issues with her balance, falls, nausea, and emotional lability/blunted responses since the injury. "My emotions have been out of wack since this concussion." Denies suicidal or homicidal ideation. She has noted some mild improvement in her balance since onset. Initially she was having some speech difficult which has since resolved. Her nausea has persisted with  decreased food intake resulting in 10-20 pound weight loss. She has tried Zofran and Phenergan without help. She was evaluated by gastroenterology who ordered an upper endoscopy however provider felt confident that her symptoms were not GI related. Pt has a prior history of migraines (2x/wk) but since the concussion has been having daily headaches. Headaches vary in location and present with light and sound sensitivity. Due to complaints of L peripheral visual deficit she was referred to neuro opthalmology who said that she had "perfect" peripheral vision" however she continues to struggle to see in her L peripheral visual field. She was initially seeing a "neuro chiropractor" after the injury but states that as her symptoms worsened they were adamant that she see a neurologist and have imaging. She is currently being followed by [facility] neurology and has an appointment at [facility] neurology August 2nd, 2022.  They have prescribed nortriptyline and recently the dose was increased. Pt denies any improvement since starting the medication but does complain of increased fatigue. She has had a head CT and brain MRI which were both normal. Previous lumbar MRI 04/30/2019 was WNL. She hasn't been driving or working since the concussion and is currently unable to go outside and play with her dogs due to fear of hitting her head. She often wears a helmet at home to protect her head due to  fear of falling. She also complains of fatigue which is limiting her activity level, however, she has been on two hikes since her concussion which was encouraging. 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 She underwent LUE EMG on 11/30/20 which showed no electrodiagnostic evidence of a large fiber neuropathy or myopathy in the left upper extremity. See additional history below    Diagnostic tests See history    Patient Stated Goals  Improve balance and decrease falls                [facility] PT Assessment - 12/28/20 0824       Standardized Balance Assessment   Standardized Balance Assessment Berg Balance Test      Berg Balance Test   Sit to Stand Able to stand without using hands and stabilize independently    Standing Unsupported Able to stand 2 minutes with supervision    Sitting with Back Unsupported but Feet Supported on Floor or Stool Able to sit safely and securely 2 minutes    Stand to Sit Sits safely with minimal use of hands    Transfers Able to transfer safely, minor use of hands    Standing Unsupported with Eyes Closed Able to stand 10 seconds with supervision    Standing Unsupported with Feet Together Able to place feet together independently and stand for 1 minute with supervision    From Standing, Reach Forward with Outstretched Arm Can reach confidently >25 cm (10")    From Standing Position, Pick up Object from Floor Able to pick up shoe safely and easily    From Standing Position, Turn to Look Behind Over each Shoulder Looks behind from both sides and weight shifts well    Turn 360 Degrees Able to turn 360 degrees safely but slowly    Standing Unsupported, Alternately Place Feet on Step/Stool Able to stand independently and safely and complete 8 steps in 20 seconds    Standing Unsupported, One Foot in Front Able to plae foot ahead of the other independently and hold 30 seconds    Standing on One Leg Able to lift leg independently and hold > 10 seconds   Balance more impaired on LLE   Total Score 50      Functional Gait  Assessment   Gait assessed  Yes    Gait Level Surface Walks 20 ft in less than 5.5 sec, no assistive devices, good speed, no evidence for imbalance, normal gait pattern, deviates no more than 6 in outside of the 12 in walkway width.    Change in Gait Speed Able to smoothly change walking speed without loss of balance or gait deviation. Deviate no more than 6 in outside of the 12 in  walkway width.    Gait with Horizontal Head Turns Performs head turns with moderate changes in gait velocity, slows down, deviates 10-15 in outside 12 in walkway width but recovers, can continue to walk.    Gait with Vertical Head Turns Performs task with slight change in gait velocity (eg, minor disruption to smooth gait path), deviates 6 - 10 in outside 12 in walkway width or uses assistive device    Gait and Pivot Turn Pivot turns safely within 3 sec and stops quickly with no loss of balance.    Step Over Obstacle Is able to step over 2 stacked shoe boxes taped together (9 in total height) without changing gait  speed. No evidence of imbalance.    Gait with Narrow Base of Support Is able to ambulate for 10 steps heel to toe with no staggering.    Gait with Eyes Closed Cannot walk 20 ft without assistance, severe gait deviations or imbalance, deviates greater than 15 in outside 12 in walkway width or will not attempt task.    Ambulating Backwards Walks 20 ft, no assistive devices, good speed, no evidence for imbalance, normal gait    Steps Alternating feet, must use rail.    Total Score 23              TREATMENT   Neuromuscular Re-education  Updated additional outcome measures and goals with patient: ABC: 55.625%   Beighton scale  LEFT  RIGHT           1. Passive dorsiflexion and hyperextension of the fifth MCP joint beyond 90  1 1   2. Passive apposition of the thumb to the flexor aspect of the forearm  1  1   3. Passive hyperextension of the elbow beyond 4. Passive hyperextension of the knee beyond 5. Active forward flexion of the trunk with the knees fully extended so that the palms of the hands rest flat on the floor   1   TOTAL         9/ 9     Cranial Nerves Deferred visual acuity Extraocular muscles are intact  Facial sensation is intact bilaterally  Facial strength is intact bilaterally  Hearing is normal as tested by gross  conversation Palate elevates midline, normal phonation  Weak L shoulder shrug Tongue protrudes midline    COORDINATION: Finger to Nose: Slow and slightly hypometric with LUE Heel to Shin: Abnormal Pronator Drift: Positive LUE Rapid Alternating Movements: Abnormal LUE Finger to Thumb Opposition: Grossly normal but slightly slower LUE   Oculomotor Exam- Fixation Suppressed  Findings Comments  Ocular Alignment normal   Spontaneous Nystagmus normal   Gaze-Holding Nystagmus normal   End-Gaze Nystagmus normal   Head Shaking Nystagmus normal   Pressure-Induced Nystagmus not examined   Hyperventilation Induced Nystagmus not examined   Skull Vibration Induced Nystagmus not examined     BPPV TESTS:  Symptoms Duration Intensity Nystagmus  L Dix-Hallpike Nausea, no vertigo   None  R Dix-Hallpike Nausea, no vertigo   None  L Head Roll None   None  R Head Roll None   None  L Sidelying Test      R Sidelying Test         FUNCTIONAL OUTCOME MEASURES    Results Comments  BERG 50/56    FGA 23/30    ABC Scale 55.6% Low balance confidence  FOTO 53 Predicted improvement to 60     VOR x 1 horizontal in sitting 30s x 3 on plain background (issued for HEP);   Additional testing performed today that was unable to be completed during initial evaluation.  Patient demonstrates significant balance deficits given age and prior activity level scoring 50/56 on the BERG and 23/30 on the FGA.  She has low balance confidence with a score of 55.6% on the ABC scale.  She does complain of hypermobility and would like to know if this could have any effect on her neck pain and additional complaints.  She did score 9 on 9 on the Beighton scale and encouraged patient to discuss these issues with her primary care provider.  In the setting of amplified musculoskeletal pain syndrome she might be appropriate for testing for hypermobility conditions.  Negative Dix-Hallpike and roll tests today for BPPV.  Fixation  suppression testing performed with infrared goggles without any spontaneous, gaze evoked, or post-headshake nystagmus.  She does present with positive pronator drift in left upper extremity as well as abnormal finger-to-nose and heel-to-shin testing.  Left upper extremity rapid alternating movements are abnormal as well as finger to thumb opposition coordination. Initiated VOR x1 horizontal during session and prescribed for HEP which is very aggravating to the patient's symptoms.  Patient encouraged to perform 3 reps of 30 seconds regularly throughout the day.  Patient encouraged to follow-up scheduled.  She will benefit from skilled physical therapy services to address deficits in balance and coordination order to return to full function at home, work, and with leisure activities.                         PT Short Term Goals - 12/23/20 1615       PT SHORT TERM GOAL #1   Title Pt will be independent with HEP in order to improve strength and balance in order to decrease fall risk and improve function at home and work.    Time 6    Period Weeks    Status New    Target Date 02/02/21               PT Long Term Goals - 12/23/20 1616       PT LONG TERM GOAL #1   Title Pt will improve BERG by at least 3 points in order to demonstrate clinically significant improvement in balance.    Baseline 12/22/20: To be completed at next session    Time 12    Period Weeks    Status New    Target Date 03/16/21      PT LONG TERM GOAL #2   Title Pt will improve FGA by at least 4 points in order to demonstrate clinically significant improvement in balance and decreased risk for falls.    Baseline 12/22/20: To be completed at next session    Time 12    Period Weeks    Status New    Target Date 03/16/21      PT LONG TERM GOAL #3   Title Pt will improve ABC by at least 13% in order to demonstrate clinically significant improvement in balance confidence    Baseline 12/22/20: To be  completed at next session    Time 12    Period Weeks    Status New    Target Date 03/16/21      PT LONG TERM GOAL #4   Title Pt will improve FOTO to at least 60 in order to demonstrate significant improvement in function related to balance deficits    Baseline 12/22/20: 53    Time 12    Period Weeks    Status New    Target Date 03/16/21                   Plan - 12/28/20 0757     Clinical Impression Statement Additional testing performed today that was unable to be completed during initial evaluation.  Patient demonstrates significant balance deficits given age and prior activity level scoring 50/56 on the BERG and 23/30 on the FGA.  She has low balance confidence with a score of 55.6% on the ABC scale.  She does complain of  hypermobility and would like to know if this could have any effect on her neck pain and additional complaints.  She did score 9 on 9 on the Beighton scale and encouraged patient to discuss these issues with her primary care provider.  In the setting of amplified musculoskeletal pain syndrome she might be appropriate for testing for hypermobility conditions.  Negative Dix-Hallpike and roll tests today for BPPV.  Fixation suppression testing performed with infrared goggles without any spontaneous, gaze evoked, or post-headshake nystagmus.  She does present with positive pronator drift in left upper extremity as well as abnormal finger-to-nose and heel-to-shin testing.  Left upper extremity rapid alternating movements are abnormal as well as finger to thumb opposition coordination. Initiated VOR x1 horizontal during session and prescribed for HEP which is very aggravating to the patient's symptoms.  Patient encouraged to perform 3 reps of 30 seconds regularly throughout the day.  Patient encouraged to follow-up scheduled.  She will benefit from skilled physical therapy services to address deficits in balance and coordination order to return to full function at home, work,  and with leisure activities.    Personal Factors and Comorbidities Comorbidity 3+    Comorbidities Amplified musculoskeletal pain syndrome, insomnia, depression, multiple concussion, migraines    Examination-Activity Limitations Caring for Others;Locomotion Level;Squat;Stand    Examination-Participation Restrictions Community Activity;Occupation;Other   Leisure activities (hiking)   Stability/Clinical Decision Making Unstable/Unpredictable    Rehab Potential Good    PT Frequency 2x / week    PT Duration 12 weeks    PT Treatment/Interventions ADLs/Self Care Home Management;Aquatic Therapy;Biofeedback;Canalith Repostioning;Cryotherapy;Electrical Stimulation;Iontophoresis 4mg /ml Dexamethasone;Moist Heat;Traction;Ultrasound;DME Instruction;Gait training;Stair training;Functional mobility training;Therapeutic activities;Therapeutic exercise;Balance training;Neuromuscular re-education;Cognitive remediation;Patient/family education;Manual techniques;Passive range of motion;Dry needling;Vestibular;Visual/perceptual remediation/compensation;Spinal Manipulations;Joint Manipulations    PT Next Visit Plan --    PT Home Exercise Plan Access Code: [email or web address]    Consulted and Agree with Plan of Care Patient;Other (Comment)   Girlfriend            Patient will benefit from skilled therapeutic intervention in order to improve the following deficits and impairments:  Decreased balance, Decreased activity tolerance, Other (comment) (Frequent falls)  Visit Diagnosis: Unsteadiness on feet  History of falling     Problem List Patient Active Problem List   Diagnosis Date Noted   Moderate episode of recurrent major depressive disorder (HCC) 10/06/2020   History of 2019 novel coronavirus disease (COVID-19) 05/20/2020   ADHD 03/03/2019   Insomnia 12/20/2018   Vitamin D deficiency 03/20/2018   Migraine 02/09/2018   Depression 02/09/2018   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 [address]. [place], [place], [place] Phone: [phone]   Fax:  [phone]  Name: Kia Varnadore MRN: [id number] Ribas Date of Birth: [birth year]

## 2020-12-28 NOTE — Patient Instructions (Signed)
Access Code: [email or web address] URL: [email or web address] Date: 12/28/2020 Prepared by: Ria Comment  Exercises Seated Gaze Stabilization with Head Rotation - 4 x daily - 7 x weekly - 3 reps - 30 seconds hold

## 2020-12-31 ENCOUNTER — Other Ambulatory Visit: Payer: Self-pay

## 2020-12-31 ENCOUNTER — Ambulatory Visit

## 2020-12-31 DIAGNOSIS — Z9181 History of falling: Secondary | ICD-10-CM

## 2020-12-31 DIAGNOSIS — R2681 Unsteadiness on feet: Secondary | ICD-10-CM

## 2020-12-31 NOTE — Patient Instructions (Signed)
Access Code: [email or web address] URL: [email or web address] Date: 12/31/2020 Prepared by: Ria Comment  Exercises Seated Gaze Stabilization with Head Rotation - 4 x daily - 7 x weekly - 3 reps - 30 seconds hold Pencil Pushups - 2 x daily - 7 x weekly - 3 reps - 1 minute hold

## 2020-12-31 NOTE — Therapy (Signed)
[facility] [address] [place], [place], [address] Phone: [phone]   Fax:  [phone]  Physical Therapy Treatment  Patient Details  Name: Achol Azpeitia MRN: [id number] Date of Birth: Dec 17, 1995 Referring Provider (PT): Theora Master   Encounter Date: 12/31/2020   PT End of Session - 12/31/20 0856     Visit Number 3    Number of Visits 25    Date for PT Re-Evaluation 03/16/21    Authorization Type eval: 12/23/20    PT Start Time 0850    PT Stop Time 0930    PT Time Calculation (min) 40 min    Activity Tolerance Patient tolerated treatment well    Behavior During Therapy [facility] for tasks assessed/performed             Past Medical History:  Diagnosis Date   Anxiety    Concussion    Depression    Dyschromia    Head ache    Migraine     Past Surgical History:  Procedure Laterality Date   Thumb Surgery, Left     TONSILLECTOMY AND ADENOIDECTOMY      There were no vitals filed for this visit.   Subjective Assessment - 12/31/20 0853     Subjective Patient reports she is doing all right upon arrival today.  She reports persistent symptoms into the left upper extremity and has been having "scaitic" pain which is primarily in her LLE but also sometimes is in her RLE. She would like to know the cause of her L pronator drift when her eyes are closed.    Pertinent History In April patient was hit in the back of her head by a dog. "Since then I have fallen and hit my head more than 20 times." She has had persistent issues with her balance, falls, nausea, and emotional lability/blunted responses since the injury. "My emotions have been out of wack since this concussion." Denies suicidal or homicidal ideation. She has noted some mild improvement in her balance since onset. Initially she was having some speech difficult which has since resolved. Her nausea has persisted with decreased food intake resulting in 10-20 pound weight  loss. She has tried Zofran and Phenergan without help. She was evaluated by gastroenterology who ordered an upper endoscopy however provider felt confident that her symptoms were not GI related. Pt has a prior history of migraines (2x/wk) but since the concussion has been having daily headaches. Headaches vary in location and present with light and sound sensitivity. Due to complaints of L peripheral visual deficit she was referred to neuro opthalmology who said that she had "perfect" peripheral vision" however she continues to struggle to see in her L peripheral visual field. She was initially seeing a "neuro chiropractor" after the injury but states that as her symptoms worsened they were adamant that she see a neurologist and have imaging. She is currently being followed by [facility] neurology and has an appointment at [facility] neurology August 2nd, 2022.  They have prescribed nortriptyline and recently the dose was increased. Pt denies any improvement since starting the medication but does complain of increased fatigue. She has had a head CT and brain MRI which were both normal. Previous lumbar MRI 04/30/2019 was WNL. She hasn't been driving or working since the concussion and is currently unable to go outside and play with her dogs due to fear of hitting her head. She often wears a helmet at home to protect her head due to  fear of falling. She also complains of fatigue which is limiting her activity level, however, she has been on two hikes since her concussion which was encouraging. 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 She underwent LUE EMG on 11/30/20 which showed no electrodiagnostic evidence of a large fiber neuropathy or myopathy in the left upper extremity. See additional history below    Diagnostic tests See history    Patient Stated Goals Improve balance and decrease falls                    TREATMENT   Neuromuscular Re-education  NuStep intervals L2/4, 45s each, Brock string at 14", 4', and 9' with convergence/divergence and 3s hold on each bead; VOR x 1 horizontal in sitting 30s x 2 on plain background, attempted in standing with wide BOS and pt was able to complete 30s but was very unsteady; Feet apart eyes open/closed x 30s each; Feet apart eyes open with horizontal and vertical head turns x 30s each; Feet together eyes open/closed x 30s each; Feet together eyes open with horizontal and vertical head turns x 30s each; Tandem balance alternating forward LE x 30s each; Tandem gait in // bars x 4 lengths; Education about how to perform pencil push-ups at home and issued for HEP;   Pt educated throughout session about proper posture and technique with exercises. Improved exercise technique, movement at target joints, use of target muscles after min to mod verbal, visual, tactile cues.    Patient demonstrates excellent motivation during session today.  Repeated VOR x1 in sitting which continues to be aggravating for patient symptoms however she is able to complete as instructed.  Attempted in standing however patient is very unsteady so regressed back to sitting.  Introduced Automotive engineer exercises during session today which are also challenging for patient and issued pencil push-ups for her to perform as part of her HEP.  Introduced additional standing balance exercises today while varying base of support to provide different challenges to stability.  Discussed the unclear nature of her positive pronator drift and left-sided weakness in light of normal brain MRI.  Could be related to left upper extremity weakness from possible nerve root compression in cervical spine as evidenced on cervical MRI. Patient encouraged to follow-up scheduled.  She will benefit from skilled physical therapy services to address deficits in balance and coordination  order to return to full function at home, work, and with leisure activities.                         PT Short Term Goals - 12/23/20 1615       PT SHORT TERM GOAL #1   Title Pt will be independent with HEP in order to improve strength and balance in order to decrease fall risk and improve function at home and work.    Time 6    Period Weeks    Status New    Target Date 02/02/21               PT Long Term Goals - 12/31/20 1041       PT LONG TERM GOAL #1   Title Pt will improve BERG by at least 3 points in order to demonstrate clinically significant improvement in balance.    Baseline 12/22/20: To be completed at next session; 12/28/20: 50/56  Time 12    Period Weeks    Status New    Target Date 03/16/21      PT LONG TERM GOAL #2   Title Pt will improve FGA by at least 4 points in order to demonstrate clinically significant improvement in balance and decreased risk for falls.    Baseline 12/22/20: To be completed at next session; 12/28/20: 23/30    Time 12    Period Weeks    Status New    Target Date 03/16/21      PT LONG TERM GOAL #3   Title Pt will improve ABC by at least 13% in order to demonstrate clinically significant improvement in balance confidence    Baseline 12/22/20: To be completed at next session; 12/28/20: 55.6%    Time 12    Period Weeks    Status New    Target Date 03/16/21      PT LONG TERM GOAL #4   Title Pt will improve FOTO to at least 60 in order to demonstrate significant improvement in function related to balance deficits    Baseline 12/22/20: 53    Time 12    Period Weeks    Status New    Target Date 03/16/21                   Plan - 12/31/20 0858     Clinical Impression Statement Patient demonstrates excellent motivation during session today.  Repeated VOR x1 in sitting which continues to be aggravating for patient symptoms however she is able to complete as instructed.  Attempted in standing however patient is  very unsteady so regressed back to sitting.  Introduced Automotive engineer exercises during session today which are also challenging for patient and issued pencil push-ups for her to perform as part of her HEP.  Introduced additional standing balance exercises today while varying base of support to provide different challenges to stability.  Discussed the unclear nature of her positive pronator drift and left-sided weakness in light of normal brain MRI.  Could be related to left upper extremity weakness from possible nerve root compression in cervical spine as evidenced on cervical MRI. Patient encouraged to follow-up scheduled.  She will benefit from skilled physical therapy services to address deficits in balance and coordination order to return to full function at home, work, and with leisure activities.    Personal Factors and Comorbidities Comorbidity 3+    Comorbidities Amplified musculoskeletal pain syndrome, insomnia, depression, multiple concussion, migraines    Examination-Activity Limitations Caring for Others;Locomotion Level;Squat;Stand    Examination-Participation Restrictions Community Activity;Occupation;Other   Leisure activities (hiking)   Stability/Clinical Decision Making Unstable/Unpredictable    Rehab Potential Good    PT Frequency 2x / week    PT Duration 12 weeks    PT Treatment/Interventions ADLs/Self Care Home Management;Aquatic Therapy;Biofeedback;Canalith Repostioning;Cryotherapy;Electrical Stimulation;Iontophoresis 4mg /ml Dexamethasone;Moist Heat;Traction;Ultrasound;DME Instruction;Gait training;Stair training;Functional mobility training;Therapeutic activities;Therapeutic exercise;Balance training;Neuromuscular re-education;Cognitive remediation;Patient/family education;Manual techniques;Passive range of motion;Dry needling;Vestibular;Visual/perceptual remediation/compensation;Spinal Manipulations;Joint Manipulations    PT Next Visit Plan Progress balance  exercises as well as habituation/adaptation    PT Home Exercise Plan Access Code: [email or web address]    Consulted and Agree with Plan of Care Patient;Other (Comment)   Girlfriend            Patient will benefit from skilled therapeutic intervention in order to improve the following deficits and impairments:  Decreased balance, Decreased activity tolerance, Other (comment) (Frequent falls)  Visit Diagnosis: Unsteadiness on feet  History of falling  Problem List Patient Active Problem List   Diagnosis Date Noted   Moderate episode of recurrent major depressive disorder (HCC) 10/06/2020   History of 2019 novel coronavirus disease (COVID-19) 05/20/2020   ADHD 03/03/2019   Insomnia 12/20/2018   Vitamin D deficiency 03/20/2018   Migraine 02/09/2018   Depression 02/09/2018   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 [place], [place], [address] Phone: [phone]   Fax:  [phone]  Name: Colette Ribasrianna Petrizzo MRN: [id number] Date of Birth: May 15, 1996

## 2021-01-04 ENCOUNTER — Ambulatory Visit: Attending: Neurology

## 2021-01-04 ENCOUNTER — Other Ambulatory Visit: Payer: Self-pay

## 2021-01-04 DIAGNOSIS — Z9181 History of falling: Secondary | ICD-10-CM | POA: Diagnosis present

## 2021-01-04 DIAGNOSIS — R2681 Unsteadiness on feet: Secondary | ICD-10-CM | POA: Diagnosis not present

## 2021-01-04 NOTE — Therapy (Signed)
[place] [facility] [address] [place], [place], [address] Phone: [phone]   Fax:  [phone]  Physical Therapy Treatment  Patient Details  Name: Brooke [name] MRN: [id number] Date of Birth: [birth year] Referring Provider (PT): Theora Master   Encounter Date: 01/04/2021   PT End of Session - 01/04/21 0816     Visit Number 4    Number of Visits 25    Date for PT Re-Evaluation 03/16/21    Authorization Type eval: 12/23/20    PT Start Time 0803    PT Stop Time 0845    PT Time Calculation (min) 42 min    Activity Tolerance Patient tolerated treatment well    Behavior During Therapy [facility] for tasks assessed/performed             Past Medical History:  Diagnosis Date   Anxiety    Concussion    Depression    Dyschromia    Head ache    Migraine     Past Surgical History:  Procedure Laterality Date   Thumb Surgery, Left     TONSILLECTOMY AND ADENOIDECTOMY      There were no vitals filed for this visit.   Subjective Assessment - 01/04/21 0808     Subjective Patient reports she is doing all right upon arrival today. She reports 6/10 headache pain currently.  Denies any LUE or LLE numbness. No major changes since last therapy session.    Pertinent History In April patient was hit in the back of her head by a dog. "Since then I have fallen and hit my head more than 20 times." She has had persistent issues with her balance, falls, nausea, and emotional lability/blunted responses since the injury. "My emotions have been out of wack since this concussion." Denies suicidal or homicidal ideation. She has noted some mild improvement in her balance since onset. Initially she was having some speech difficult which has since resolved. Her nausea has persisted with decreased food intake resulting in 10-20 pound weight loss. She has tried Zofran and Phenergan without help. She was evaluated by gastroenterology who ordered an upper endoscopy  however provider felt confident that her symptoms were not GI related. Pt has a prior history of migraines (2x/wk) but since the concussion has been having daily headaches. Headaches vary in location and present with light and sound sensitivity. Due to complaints of L peripheral visual deficit she was referred to neuro opthalmology who said that she had "perfect" peripheral vision" however she continues to struggle to see in her L peripheral visual field. She was initially seeing a "neuro chiropractor" after the injury but states that as her symptoms worsened they were adamant that she see a neurologist and have imaging. She is currently being followed by [facility] neurology and has an appointment at [facility] neurology August 2nd, 2022.  They have prescribed nortriptyline and recently the dose was increased. Pt denies any improvement since starting the medication but does complain of increased fatigue. She has had a head CT and brain MRI which were both normal. Previous lumbar MRI 04/30/2019 was WNL. She hasn't been driving or working since the concussion and is currently unable to go outside and play with her dogs due to fear of hitting her head. She often wears a helmet at home to protect her head due to fear of falling. She also complains of fatigue which is limiting her activity level, however, she has been on two hikes since her concussion which was  encouraging. 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 She underwent LUE EMG on 11/30/20 which showed no electrodiagnostic evidence of a large fiber neuropathy or myopathy in the left upper extremity. See additional history below    Diagnostic tests See history    Patient Stated Goals Improve balance and decrease falls                   TREATMENT   Neuromuscular Re-education  NuStep intervals L2/4, 45s each, VOR x 1 horizontal in sitting 30s x 2  on plain background; VOR x 1 horizontal in standing 30s x 2 on plain background, significantly improved stability compared to last visit; Brock string at 14", 4', and 9' with convergence/divergence and 3s hold on each bead 3 x 60s, significantly improved stability compared to last visit; Feet together eyes open/closed x 30s each; Feet together eyes open with horizontal and vertical head turns x 30s each; Tandem balance alternating forward LE x 30s each; Tandem gait in // bars x 4 lengths; Gait with horizontal and vertical ball tosses HEP updated Education about how to perform pencil push-ups at home and issued for HEP;   Pt educated throughout session about proper posture and technique with exercises. Improved exercise technique, movement at target joints, use of target muscles after min to mod verbal, visual, tactile cues.    Patient demonstrates excellent motivation during session today.  Repeated VOR x1 in sitting patient is able to progress to standing with significantly improved stability noted today.  Also repeated Mal Amabile string convergence/divergence exercises during session today and again patient is much more stable today in standing.  Introduced ball passes during forward ambulation in hallway with head/eye follow and patient demonstrates some lateral deviation during gait.  HEP progression provided today.  Patient encouraged to follow-up scheduled.  She will benefit from skilled physical therapy services to address deficits in balance and coordination order to return to full function at home, work, and with leisure activities.                         PT Short Term Goals - 12/23/20 1615       PT SHORT TERM GOAL #1   Title Pt will be independent with HEP in order to improve strength and balance in order to decrease fall risk and improve function at home and work.    Time 6    Period Weeks    Status New    Target Date 02/02/21               PT Long Term  Goals - 12/31/20 1041       PT LONG TERM GOAL #1   Title Pt will improve BERG by at least 3 points in order to demonstrate clinically significant improvement in balance.    Baseline 12/22/20: To be completed at next session; 12/28/20: 50/56    Time 12    Period Weeks    Status New    Target Date 03/16/21      PT LONG TERM GOAL #2   Title Pt will improve FGA by at least 4 points in order to demonstrate clinically significant improvement in balance and decreased risk for falls.    Baseline 12/22/20: To be completed at next session; 12/28/20: 23/30    Time 12    Period Weeks    Status New  Target Date 03/16/21      PT LONG TERM GOAL #3   Title Pt will improve ABC by at least 13% in order to demonstrate clinically significant improvement in balance confidence    Baseline 12/22/20: To be completed at next session; 12/28/20: 55.6%    Time 12    Period Weeks    Status New    Target Date 03/16/21      PT LONG TERM GOAL #4   Title Pt will improve FOTO to at least 60 in order to demonstrate significant improvement in function related to balance deficits    Baseline 12/22/20: 53    Time 12    Period Weeks    Status New    Target Date 03/16/21                   Plan - 01/04/21 0817     Clinical Impression Statement Patient demonstrates excellent motivation during session today.  Repeated VOR x1 in sitting patient is able to progress to standing with significantly improved stability noted today.  Also repeated Mal Amabile string convergence/divergence exercises during session today and again patient is much more stable today in standing.  Introduced ball passes during forward ambulation in hallway with head/eye follow and patient demonstrates some lateral deviation during gait.  HEP progression provided today.  Patient encouraged to follow-up scheduled.  She will benefit from skilled physical therapy services to address deficits in balance and coordination order to return to full function at  home, work, and with leisure activities.    Personal Factors and Comorbidities Comorbidity 3+    Comorbidities Amplified musculoskeletal pain syndrome, insomnia, depression, multiple concussion, migraines    Examination-Activity Limitations Caring for Others;Locomotion Level;Squat;Stand    Examination-Participation Restrictions Community Activity;Occupation;Other   Leisure activities (hiking)   Stability/Clinical Decision Making Unstable/Unpredictable    Rehab Potential Good    PT Frequency 2x / week    PT Duration 12 weeks    PT Treatment/Interventions ADLs/Self Care Home Management;Aquatic Therapy;Biofeedback;Canalith Repostioning;Cryotherapy;Electrical Stimulation;Iontophoresis 4mg /ml Dexamethasone;Moist Heat;Traction;Ultrasound;DME Instruction;Gait training;Stair training;Functional mobility training;Therapeutic activities;Therapeutic exercise;Balance training;Neuromuscular re-education;Cognitive remediation;Patient/family education;Manual techniques;Passive range of motion;Dry needling;Vestibular;Visual/perceptual remediation/compensation;Spinal Manipulations;Joint Manipulations    PT Next Visit Plan Progress balance exercises as well as habituation/adaptation    PT Home Exercise Plan Access Code: [email or web address]    Consulted and Agree with Plan of Care Patient;Other (Comment)   Girlfriend            Patient will benefit from skilled therapeutic intervention in order to improve the following deficits and impairments:  Decreased balance, Decreased activity tolerance, Other (comment) (Frequent falls)  Visit Diagnosis: Unsteadiness on feet  History of falling     Problem List Patient Active Problem List   Diagnosis Date Noted   Moderate episode of recurrent major depressive disorder (HCC) 10/06/2020   History of 2019 novel coronavirus disease (COVID-19) 05/20/2020   ADHD 03/03/2019   Insomnia 12/20/2018   Vitamin D deficiency 03/20/2018   Migraine 02/09/2018   Depression 02/09/2018    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 [place], [place], [place] Phone: [phone]   Fax:  [phone]  Name: Brooke [name] MRN: Colette Ribas Date of Birth: [birth year]

## 2021-01-04 NOTE — Patient Instructions (Signed)
Access Code: [email or web address] URL: [email or web address] Date: 01/04/2021 Prepared by: Ria Comment  Exercises Pencil Pushups - 2 x daily - 7 x weekly - 3 reps - 1 minute hold Romberg Stance with Head Rotation - 2 x daily - 7 x weekly - 3 reps - 1 min hold Standing Gaze Stabilization with Head Rotation - 4 x daily - 7 x weekly - 3 reps - 60 seconds hold

## 2021-01-06 ENCOUNTER — Other Ambulatory Visit: Payer: Self-pay

## 2021-01-06 ENCOUNTER — Ambulatory Visit

## 2021-01-06 DIAGNOSIS — R2681 Unsteadiness on feet: Secondary | ICD-10-CM

## 2021-01-06 DIAGNOSIS — Z9181 History of falling: Secondary | ICD-10-CM

## 2021-01-06 NOTE — Therapy (Signed)
[facility] [address]. [place], [place], [address] Phone: [phone]   Fax:  [phone]  Physical Therapy Treatment  Patient Details  Name: Brooke [name] MRN: [id number] Date of Birth: [birth year] Referring Provider (PT): Theora Master   Encounter Date: 01/06/2021   PT End of Session - 01/06/21 1334     Visit Number 5    Number of Visits 25    Date for PT Re-Evaluation 03/16/21    Authorization Type eval: 12/23/20    PT Start Time 1314    Activity Tolerance Patient tolerated treatment well    Behavior During Therapy [facility] for tasks assessed/performed             Past Medical History:  Diagnosis Date   Anxiety    Concussion    Depression    Dyschromia    Head ache    Migraine     Past Surgical History:  Procedure Laterality Date   Thumb Surgery, Left     TONSILLECTOMY AND ADENOIDECTOMY      There were no vitals filed for this visit.   Subjective Assessment - 01/06/21 1333     Subjective Patient reports she is doing all right upon arrival today. She reports 5/10 headache pain currently. LLE pain 7/10 upon arrival. She saw [facility] neurology and they referred her to genetics and headache specialist. No significant improvement noted by patient in symptoms. No major changes since last therapy session.    Pertinent History In April patient was hit in the back of her head by a dog. "Since then I have fallen and hit my head more than 20 times." She has had persistent issues with her balance, falls, nausea, and emotional lability/blunted responses since the injury. "My emotions have been out of wack since this concussion." Denies suicidal or homicidal ideation. She has noted some mild improvement in her balance since onset. Initially she was having some speech difficult which has since resolved. Her nausea has persisted with decreased food intake resulting in 10-20 pound weight loss. She has tried Zofran and Phenergan without  help. She was evaluated by gastroenterology who ordered an upper endoscopy however provider felt confident that her symptoms were not GI related. Pt has a prior history of migraines (2x/wk) but since the concussion has been having daily headaches. Headaches vary in location and present with light and sound sensitivity. Due to complaints of L peripheral visual deficit she was referred to neuro opthalmology who said that she had "perfect" peripheral vision" however she continues to struggle to see in her L peripheral visual field. She was initially seeing a "neuro chiropractor" after the injury but states that as her symptoms worsened they were adamant that she see a neurologist and have imaging. She is currently being followed by [facility] neurology and has an appointment at [facility] neurology August 2nd, 2022.  They have prescribed nortriptyline and recently the dose was increased. Pt denies any improvement since starting the medication but does complain of increased fatigue. She has had a head CT and brain MRI which were both normal. Previous lumbar MRI 04/30/2019 was WNL. She hasn't been driving or working since the concussion and is currently unable to go outside and play with her dogs due to fear of hitting her head. She often wears a helmet at home to protect her head due to fear of falling. She also complains of fatigue which is limiting her activity level, however, she has been on two hikes since her  concussion which was encouraging. 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 She underwent LUE EMG on 11/30/20 which showed no electrodiagnostic evidence of a large fiber neuropathy or myopathy in the left upper extremity. See additional history below    Diagnostic tests See history    Patient Stated Goals Improve balance and decrease falls                   TREATMENT   Neuromuscular Re-education   NuStep intervals L2/4, BLE only, 45s each x 5 minutes for warm-up during history VOR x 1 horizontal in standing (wide stance) x 30s on plain background, improved stability so progressed to feet together 30s x 2; Feet apart eyes closed x 30s; Feet apart eyes open with horizontal and vertical head turns x 30s each; Airex feet apart with eyes closed x 30s; Airex feet apart with eyes open with horizontal and vertical head turns x 30s each; Gait in hallway with horizontal ball tosses to therapist x 70' each direction with head/eye follow; Gait in hallway with vertical ball tosses to self 70' x 2; Body rolls on wall, eyes open, single x 4 each direction; Body rolls on wall, eyes open, double x 2 each direction;   Pt educated throughout session about proper posture and technique with exercises. Improved exercise technique, movement at target joints, use of target muscles after min to mod verbal, visual, tactile cues.    Patient demonstrates excellent motivation during session today.  Repeated VOR x1 in standing and pt is able to progress to feet together today. Also repeated ball tosses in hallway with horizontal toss to therapist as well as vertical toss to self with head/eye follow. She demonstrates intermittent staggering/instability. Introduced ball rolls on wall which provokes dizziness for patient. Will progress gaze stabilization and habituation exercises in follow-up sessions.  Patient encouraged to follow-up scheduled.  She will benefit from skilled physical therapy services to address deficits in balance and coordination order to return to full function at home, work, and with leisure activities.                         PT Short Term Goals - 12/23/20 1615       PT SHORT TERM GOAL #1   Title Pt will be independent with HEP in order to improve strength and balance in order to decrease fall risk and improve function at home and work.    Time 6    Period Weeks    Status  New    Target Date 02/02/21               PT Long Term Goals - 12/31/20 1041       PT LONG TERM GOAL #1   Title Pt will improve BERG by at least 3 points in order to demonstrate clinically significant improvement in balance.    Baseline 12/22/20: To be completed at next session; 12/28/20: 50/56    Time 12    Period Weeks    Status New    Target Date 03/16/21      PT LONG TERM GOAL #2   Title Pt will improve FGA by at least 4 points in order to demonstrate clinically significant improvement in balance and decreased risk for falls.    Baseline 12/22/20: To be completed at next session; 12/28/20: 23/30    Time 12  Period Weeks    Status New    Target Date 03/16/21      PT LONG TERM GOAL #3   Title Pt will improve ABC by at least 13% in order to demonstrate clinically significant improvement in balance confidence    Baseline 12/22/20: To be completed at next session; 12/28/20: 55.6%    Time 12    Period Weeks    Status New    Target Date 03/16/21      PT LONG TERM GOAL #4   Title Pt will improve FOTO to at least 60 in order to demonstrate significant improvement in function related to balance deficits    Baseline 12/22/20: 53    Time 12    Period Weeks    Status New    Target Date 03/16/21                   Plan - 01/06/21 1351     Clinical Impression Statement Patient demonstrates excellent motivation during session today.  Repeated VOR x1 in standing and pt is able to progress to feet together today. Also repeated ball tosses in hallway with horizontal toss to therapist as well as vertical toss to self with head/eye follow. She demonstrates intermittent staggering/instability. Introduced ball rolls on wall which provokes dizziness for patient. Will progress gaze stabilization and habituation exercises in follow-up sessions.  Patient encouraged to follow-up scheduled.  She will benefit from skilled physical therapy services to address deficits in balance and  coordination order to return to full function at home, work, and with leisure activities.    Personal Factors and Comorbidities Comorbidity 3+    Comorbidities Amplified musculoskeletal pain syndrome, insomnia, depression, multiple concussion, migraines    Examination-Activity Limitations Caring for Others;Locomotion Level;Squat;Stand    Examination-Participation Restrictions Community Activity;Occupation;Other   Leisure activities (hiking)   Stability/Clinical Decision Making Unstable/Unpredictable    Rehab Potential Good    PT Frequency 2x / week    PT Duration 12 weeks    PT Treatment/Interventions ADLs/Self Care Home Management;Aquatic Therapy;Biofeedback;Canalith Repostioning;Cryotherapy;Electrical Stimulation;Iontophoresis 4mg /ml Dexamethasone;Moist Heat;Traction;Ultrasound;DME Instruction;Gait training;Stair training;Functional mobility training;Therapeutic activities;Therapeutic exercise;Balance training;Neuromuscular re-education;Cognitive remediation;Patient/family education;Manual techniques;Passive range of motion;Dry needling;Vestibular;Visual/perceptual remediation/compensation;Spinal Manipulations;Joint Manipulations    PT Next Visit Plan Progress balance exercises as well as habituation/adaptation    PT Home Exercise Plan Access Code: [email or web address]    Consulted and Agree with Plan of Care Patient;Other (Comment)   Girlfriend            Patient will benefit from skilled therapeutic intervention in order to improve the following deficits and impairments:  Decreased balance, Decreased activity tolerance, Other (comment) (Frequent falls)  Visit Diagnosis: Unsteadiness on feet  History of falling     Problem List Patient Active Problem List   Diagnosis Date Noted   Moderate episode of recurrent major depressive disorder (HCC) 10/06/2020   History of 2019 novel coronavirus disease (COVID-19) 05/20/2020   ADHD 03/03/2019   Insomnia 12/20/2018   Vitamin D deficiency 03/20/2018    Migraine 02/09/2018   Depression 02/09/2018   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 [place], [place], [place] Phone: [phone]   Fax:  [phone]  Name: Brooke [name] MRN: Colette Ribas Date of Birth: 1995-08-20

## 2021-01-11 ENCOUNTER — Other Ambulatory Visit: Payer: Self-pay

## 2021-01-11 ENCOUNTER — Ambulatory Visit

## 2021-01-11 DIAGNOSIS — R2681 Unsteadiness on feet: Secondary | ICD-10-CM

## 2021-01-11 DIAGNOSIS — Z9181 History of falling: Secondary | ICD-10-CM

## 2021-01-11 NOTE — Patient Instructions (Signed)
Access Code: [email or web address] URL: [email or web address] Date: 01/11/2021 Prepared by: Ria Comment  Exercises Pencil Pushups - 2 x daily - 7 x weekly - 3 reps - 1 minute hold Romberg Stance with Eyes Closed - 2 x daily - 7 x weekly - 3 reps - 30s hold Romberg Stance with Head Rotation - 2 x daily - 7 x weekly - 3 reps - 1 min hold Standing Gaze Stabilization with Head Rotation - 4 x daily - 7 x weekly - 3 reps - 60 seconds hold

## 2021-01-11 NOTE — Therapy (Signed)
[facility] [address] [place], [place], [address] Phone: [phone]   Fax:  [phone]  Physical Therapy Treatment  Patient Details  Name: Brooke [name] MRN: [id number] Date of Birth: 10/09/1995 Referring Provider (PT): Theora Master   Encounter Date: 01/11/2021   PT End of Session - 01/11/21 1728     Visit Number 6    Number of Visits 25    Date for PT Re-Evaluation 03/16/21    Authorization Type eval: 12/23/20    PT Start Time 1015    PT Stop Time 1100    PT Time Calculation (min) 45 min    Activity Tolerance Patient tolerated treatment well    Behavior During Therapy [facility] for tasks assessed/performed              Past Medical History:  Diagnosis Date   Anxiety    Concussion    Depression    Dyschromia    Head ache    Migraine     Past Surgical History:  Procedure Laterality Date   Thumb Surgery, Left     TONSILLECTOMY AND ADENOIDECTOMY      There were no vitals filed for this visit.   Subjective Assessment - 01/11/21 1019     Subjective Patient reports she is doing alright upon arrival today. She denies headache pain currently. No LLE/LUE pain currently. She is having a lot of nausea today. She saw [facility] physiatry last week who was concerned about her L pronator drift and referred her to the ED. Pt did not feel like it was was necessary as this has been ongoing for a while so she decided to wait until her brain MRI on 01/14/21. No major changes since last therapy session.    Pertinent History In April patient was hit in the back of her head by a dog. "Since then I have fallen and hit my head more than 20 times." She has had persistent issues with her balance, falls, nausea, and emotional lability/blunted responses since the injury. "My emotions have been out of wack since this concussion." Denies suicidal or homicidal ideation. She has noted some mild improvement in her balance since onset. Initially she  was having some speech difficult which has since resolved. Her nausea has persisted with decreased food intake resulting in 10-20 pound weight loss. She has tried Zofran and Phenergan without help. She was evaluated by gastroenterology who ordered an upper endoscopy however provider felt confident that her symptoms were not GI related. Pt has a prior history of migraines (2x/wk) but since the concussion has been having daily headaches. Headaches vary in location and present with light and sound sensitivity. Due to complaints of L peripheral visual deficit she was referred to neuro opthalmology who said that she had "perfect" peripheral vision" however she continues to struggle to see in her L peripheral visual field. She was initially seeing a "neuro chiropractor" after the injury but states that as her symptoms worsened they were adamant that she see a neurologist and have imaging. She is currently being followed by [facility] neurology and has an appointment at [facility] neurology August 2nd, 2022.  They have prescribed nortriptyline and recently the dose was increased. Pt denies any improvement since starting the medication but does complain of increased fatigue. She has had a head CT and brain MRI which were both normal. Previous lumbar MRI 04/30/2019 was WNL. She hasn't been driving or working since the concussion and is currently unable to go  outside and play with her dogs due to fear of hitting her head. She often wears a helmet at home to protect her head due to fear of falling. She also complains of fatigue which is limiting her activity level, however, she has been on two hikes since her concussion which was encouraging. 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 She underwent LUE EMG on 11/30/20 which showed no electrodiagnostic evidence of a large fiber neuropathy or myopathy in the left upper extremity. See  additional history below    Diagnostic tests See history    Patient Stated Goals Improve balance and decrease falls                   TREATMENT   Neuromuscular Re-education  NuStep intervals L2/4-5, BLE only, 45s each x 5 minutes for warm-up during history followed by 1 minute cooldown; VOR x 1 horizontal in standing (wide stance) x 60s on patterned background, pt reports some fuzziness/whooziness afterward but no worsening of nausea; VOR x 1 horizontal in standing (feet together) 2 x 60s on patterned background, pt reports some fuzziness/whooziness; Forward gait in hallway with horizontal ball tosses to therapist 2 x 70' each direction with head/eye follow; Retro gait in hallway with horizontal ball tosses to therapist 2 x 70' each direction with head/eye follow; Forward gait in hallway with vertical ball tosses to self 70' x 2; Retro gait in hallway with vertical ball tosses to self 70' x 2; Airex balance beam tandem gait x 4 lengths; Airex balance beam tandem balance with horizontal head turns alternating forward LE 2 x 30s each; Airex balance beam side stepping with horizontal head turns x 6 lengths; HEP progression provided;   Pt educated throughout session about proper posture and technique with exercises. Improved exercise technique, movement at target joints, use of target muscles after min to mod verbal, visual, tactile cues.    Patient demonstrates excellent motivation during session today.  Significantly improved stability noted today during gaze stabilization exercises as well as ball tosses during gait today. Overall pt is making excellent progress.  Progression provided to gaze stabilization exercise with patterned background in narrow stance. Also progressed ball tosses in hallway to retro ambulation  In tandem stance on Airex balance beam she does have decreased stability when left lower extremity is the primary rear stabilizing lower extremity. Patient encouraged  to follow-up scheduled.  She will benefit from skilled physical therapy services to address deficits in balance and coordination order to return to full function at home, work, and with leisure activities.                         PT Short Term Goals - 12/23/20 1615       PT SHORT TERM GOAL #1   Title Pt will be independent with HEP in order to improve strength and balance in order to decrease fall risk and improve function at home and work.    Time 6    Period Weeks    Status New    Target Date 02/02/21               PT Long Term Goals - 12/31/20 1041       PT LONG TERM GOAL #1   Title Pt will improve BERG by at least 3 points in order to demonstrate clinically significant improvement in balance.  Baseline 12/22/20: To be completed at next session; 12/28/20: 50/56    Time 12    Period Weeks    Status New    Target Date 03/16/21      PT LONG TERM GOAL #2   Title Pt will improve FGA by at least 4 points in order to demonstrate clinically significant improvement in balance and decreased risk for falls.    Baseline 12/22/20: To be completed at next session; 12/28/20: 23/30    Time 12    Period Weeks    Status New    Target Date 03/16/21      PT LONG TERM GOAL #3   Title Pt will improve ABC by at least 13% in order to demonstrate clinically significant improvement in balance confidence    Baseline 12/22/20: To be completed at next session; 12/28/20: 55.6%    Time 12    Period Weeks    Status New    Target Date 03/16/21      PT LONG TERM GOAL #4   Title Pt will improve FOTO to at least 60 in order to demonstrate significant improvement in function related to balance deficits    Baseline 12/22/20: 53    Time 12    Period Weeks    Status New    Target Date 03/16/21                   Plan - 01/11/21 1053     Clinical Impression Statement Patient demonstrates excellent motivation during session today.  Significantly improved stability noted  today during gaze stabilization exercises as well as ball tosses during gait today. Overall pt is making excellent progress.  Progression provided to gaze stabilization exercise with patterned background in narrow stance. Also progressed ball tosses in hallway to retro ambulation  In tandem stance on Airex balance beam she does have decreased stability when left lower extremity is the primary rear stabilizing lower extremity. Patient encouraged to follow-up scheduled.  She will benefit from skilled physical therapy services to address deficits in balance and coordination order to return to full function at home, work, and with leisure activities.    Personal Factors and Comorbidities Comorbidity 3+    Comorbidities Amplified musculoskeletal pain syndrome, insomnia, depression, multiple concussion, migraines    Examination-Activity Limitations Caring for Others;Locomotion Level;Squat;Stand    Examination-Participation Restrictions Community Activity;Occupation;Other   Leisure activities (hiking)   Stability/Clinical Decision Making Unstable/Unpredictable    Rehab Potential Good    PT Frequency 2x / week    PT Duration 12 weeks    PT Treatment/Interventions ADLs/Self Care Home Management;Aquatic Therapy;Biofeedback;Canalith Repostioning;Cryotherapy;Electrical Stimulation;Iontophoresis 4mg /ml Dexamethasone;Moist Heat;Traction;Ultrasound;DME Instruction;Gait training;Stair training;Functional mobility training;Therapeutic activities;Therapeutic exercise;Balance training;Neuromuscular re-education;Cognitive remediation;Patient/family education;Manual techniques;Passive range of motion;Dry needling;Vestibular;Visual/perceptual remediation/compensation;Spinal Manipulations;Joint Manipulations    PT Next Visit Plan Progress balance exercises as well as habituation/adaptation    PT Home Exercise Plan Access Code: [email or web address]    Consulted and Agree with Plan of Care Patient;Other (Comment)   Girlfriend              Patient will benefit from skilled therapeutic intervention in order to improve the following deficits and impairments:  Decreased balance, Decreased activity tolerance, Other (comment) (Frequent falls)  Visit Diagnosis: Unsteadiness on feet  History of falling     Problem List Patient Active Problem List   Diagnosis Date Noted   Moderate episode of recurrent major depressive disorder (HCC) 10/06/2020   History of 2019 novel coronavirus disease (COVID-19) 05/20/2020   ADHD 03/03/2019  Insomnia 12/20/2018   Vitamin D deficiency 03/20/2018   Migraine 02/09/2018   Depression 02/09/2018   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 [place], [place], [address] Phone: [phone]   Fax:  [phone]  Name: Colette Ribasrianna Mol MRN: [id number] Date of Birth: November 16, 1995

## 2021-01-13 ENCOUNTER — Ambulatory Visit: Payer: Self-pay | Admitting: *Deleted

## 2021-01-13 ENCOUNTER — Other Ambulatory Visit: Payer: Self-pay | Admitting: Nurse Practitioner

## 2021-01-13 DIAGNOSIS — G43101 Migraine with aura, not intractable, with status migrainosus: Secondary | ICD-10-CM

## 2021-01-13 NOTE — Telephone Encounter (Signed)
Pt reports numbness and tingling of lips and tongue x 2 days. Denies any facial drooping. Denies any swelling, no dysphagia., can eat, drink normally. States has been wearing masks more frequently, "Thought maybe from that, told I might need blood work." States had rash on chin 1 week ago, resolved, none presently. No rash elsewhere. No fever, no visual changes. Agent had made appt for Friday prior to triage (Pt's preference.) Assured pt NT would route to practice for PCPs review and final disposition. Pt not able to be seen today as no transportation, can be seen tomorrow if availability. Advised ED for worsening symptoms, any facial drooping.  Please advise: [phone]

## 2021-01-13 NOTE — Telephone Encounter (Signed)
Requested medications are due for refill today.  yes  Requested medications are on the active medications list.  yes  Last refill. 11/23/2020  Future visit scheduled.   yes  Notes to clinic.  Medication not delegated.

## 2021-01-13 NOTE — Telephone Encounter (Signed)
Friday is fine for an appointment.

## 2021-01-13 NOTE — Telephone Encounter (Signed)
Routing to provider, patient is scheduled for an appointment on Friday 01/15/21.

## 2021-01-13 NOTE — Telephone Encounter (Signed)
Reason for Disposition  [1] Numbness or tingling on both sides of body AND [2] is a new symptom present > 24 hours    Numbness of tongue and lips x 2 days  Answer Assessment - Initial Assessment Questions 1. SYMPTOM: "What is the main symptom you are concerned about?" (e.g., weakness, numbness)     Numbness lips and tongue 2. ONSET: "When did this start?" (minutes, hours, days; while sleeping)     2 days ago 3. LAST NORMAL: "When was the last time you (the patient) were normal (no symptoms)?"     2 days ago 4. PATTERN "Does this come and go, or has it been constant since it started?"  "Is it present now?"     Constant 5. CARDIAC SYMPTOMS: "Have you had any of the following symptoms: chest pain, difficulty breathing, palpitations?"     no 6. NEUROLOGIC SYMPTOMS: "Have you had any of the following symptoms: headache, dizziness, vision loss, double vision, changes in speech, unsteady on your feet?"     no 7. OTHER SYMPTOMS: "Do you have any other symptoms?"     no  Protocols used: Neurologic Deficit-A-AH

## 2021-01-14 ENCOUNTER — Ambulatory Visit

## 2021-01-15 ENCOUNTER — Ambulatory Visit (INDEPENDENT_AMBULATORY_CARE_PROVIDER_SITE_OTHER): Admitting: Nurse Practitioner

## 2021-01-15 ENCOUNTER — Other Ambulatory Visit: Payer: Self-pay | Admitting: Family Medicine

## 2021-01-15 ENCOUNTER — Encounter: Payer: Self-pay | Admitting: Nurse Practitioner

## 2021-01-15 ENCOUNTER — Other Ambulatory Visit: Payer: Self-pay

## 2021-01-15 VITALS — BP 129/84 | HR 118 | Temp 98.3°F | Wt 145.0 lb

## 2021-01-15 DIAGNOSIS — R200 Anesthesia of skin: Secondary | ICD-10-CM | POA: Diagnosis not present

## 2021-01-15 DIAGNOSIS — R202 Paresthesia of skin: Secondary | ICD-10-CM | POA: Diagnosis not present

## 2021-01-15 DIAGNOSIS — R768 Other specified abnormal immunological findings in serum: Secondary | ICD-10-CM

## 2021-01-15 DIAGNOSIS — R21 Rash and other nonspecific skin eruption: Secondary | ICD-10-CM | POA: Diagnosis not present

## 2021-01-15 MED ORDER — PROMETHAZINE HCL 25 MG RE SUPP: 25.0000 mg | Freq: Four times a day (QID) | RECTAL | 0 refills | Status: AC | PRN

## 2021-01-15 NOTE — Progress Notes (Signed)
 BP 129/84   Pulse (!) 118   Temp 98.3 F (36.8 C)   Wt 145 lb (65.8 kg) Comment: Patient reported  SpO2 99%   BMI 27.40 kg/m    Subjective:    Patient ID: Brooke [name], female    DOB: 06/04/1996, 25 y.o.   MRN: [id number]  HPI: Brooke [name] is a 25 y.o. female  Chief Complaint  Patient presents with   Numbness    Around mouth and whole tongue that started a week ago, the only new medication is nortriptyline, which was started around a month ago. Had a rash on face around the 4th-5th of this month, no known irritant, after the rash she started with the numbness around her mouth and tongue   NUMBNESS Around mouth and whole tongue that started a week ago, the only new medication is nortriptyline, which was started around a month ago. Had a rash on face around the 4th-5th of this month, no known irritant, after the rash she started with the numbness around her mouth and tongue.  She states she took benadryl and it went away.  Patient states the numbness is always there.  Does not come and go.  Relevant past medical, surgical, family and social history reviewed and updated as indicated. Interim medical history since our last visit reviewed. Allergies and medications reviewed and updated.  Review of Systems  Neurological:  Positive for numbness.   Per HPI unless specifically indicated above     Objective:    BP 129/84   Pulse (!) 118   Temp 98.3 F (36.8 C)   Wt 145 lb (65.8 kg) Comment: Patient reported  SpO2 99%   BMI 27.40 kg/m   Wt Readings from Last 3 Encounters:  01/15/21 145 lb (65.8 kg)  10/23/20 150 lb (68 kg)  10/06/20 151 lb (68.5 kg)    Physical Exam Vitals and nursing note reviewed.  Constitutional:      General: She is not in acute distress.    Appearance: Normal appearance. She is normal weight. She is not ill-appearing, toxic-appearing or diaphoretic.  HENT:     Head: Normocephalic.     Right Ear: External ear normal.     Left Ear: External  ear normal.     Nose: Nose normal.     Mouth/Throat:     Mouth: Mucous membranes are moist.     Pharynx: Oropharynx is clear.  Eyes:     General:        Right eye: No discharge.        Left eye: No discharge.     Extraocular Movements: Extraocular movements intact.     Conjunctiva/sclera: Conjunctivae normal.     Pupils: Pupils are equal, round, and reactive to light.  Cardiovascular:     Rate and Rhythm: Normal rate and regular rhythm.     Heart sounds: No murmur heard. Pulmonary:     Effort: Pulmonary effort is normal. No respiratory distress.     Breath sounds: Normal breath sounds. No wheezing or rales.  Musculoskeletal:     Cervical back: Normal range of motion and neck supple.  Skin:    General: Skin is warm and dry.     Capillary Refill: Capillary refill takes less than 2 seconds.  Neurological:     General: No focal deficit present.     Mental Status: She is alert and oriented to person, place, and time. Mental status is at baseline.  Psychiatric:          Mood and Affect: Mood normal.        Behavior: Behavior normal.        Thought Content: Thought content normal.        Judgment: Judgment normal.    Results for orders placed or performed in visit on 09/12/19  Novel Coronavirus, NAA ([facility])   Specimen: Nasopharyngeal(NP) swabs in vial transport medium   NASOPHARYNGE  TESTING  Result Value Ref Range   SARS-CoV-2, NAA Not Detected Not Detected  SARS-COV-2, NAA 2 DAY TAT   NASOPHARYNGE  TESTING  Result Value Ref Range   SARS-CoV-2, NAA 2 DAY TAT Performed       Assessment & Plan:   Problem List Items Addressed This Visit   None Visit Diagnoses     Numbness and tingling    -  Primary   Will draw labs today to rule out vitamin deficiency and tick born illness. If labs are negative will wean down nortriptyline.   Relevant Orders   Comp Met (CMET)   CBC w/Diff   B12   Vitamin D (25 hydroxy)   Rocky mtn spotted fvr abs pnl(IgG+IgM)   Rash       Relevant  Orders   Rocky mtn spotted fvr abs pnl(IgG+IgM)   Lyme disease, western blot   Positive ANA (antinuclear antibody)       Will check lab work during visit today. Will make recommendations based on lab results.    Relevant Orders   Antinuclear Antib (ANA)   RA Qn+CCP(IgG/A)+SjoSSA+SjoSSB   Sed Rate (ESR)   C-reactive protein        Follow up plan: Return if symptoms worsen or fail to improve.       

## 2021-01-15 NOTE — Telephone Encounter (Signed)
  Notes to clinic:  The original prescription was discontinued on 04/23/2020 by Valentino Nose, NP for the following reason: Patient has not taken in last 30 days. Renewing this prescription may not be appropriate   Requested Prescriptions  Pending Prescriptions Disp Refills   DULoxetine (CYMBALTA) 60 MG capsule [Pharmacy Med Name: DULOXETINE DR 60MG  CAPSULES] 90 capsule 1    Sig: TAKE ONE CAPSULE BY MOUTH DAILY     Psychiatry: Antidepressants - SNRI Passed - 01/15/2021 11:48 AM      Passed - Completed PHQ-2 or PHQ-9 in the last 360 days      Passed - Last BP in normal range    BP Readings from Last 1 Encounters:  01/15/21 129/84          Passed - Valid encounter within last 6 months    Recent Outpatient Visits           Today Numbness and tingling   [facility], NP   2 months ago Abnormal neurological exam   [facility], NP   3 months ago Concussion without loss of consciousness, subsequent encounter   [facility], NP   7 months ago Migraine without status migrainosus, not intractable, unspecified migraine type   [facility], DO   8 months ago COVID-19   [facility], DO

## 2021-01-18 ENCOUNTER — Ambulatory Visit

## 2021-01-18 ENCOUNTER — Other Ambulatory Visit: Payer: Self-pay

## 2021-01-18 ENCOUNTER — Other Ambulatory Visit: Payer: Self-pay | Admitting: Family Medicine

## 2021-01-18 DIAGNOSIS — R2681 Unsteadiness on feet: Secondary | ICD-10-CM | POA: Diagnosis not present

## 2021-01-18 DIAGNOSIS — Z9181 History of falling: Secondary | ICD-10-CM

## 2021-01-18 MED ORDER — VITAMIN D (ERGOCALCIFEROL) 1.25 MG (50000 UNIT) PO CAPS: 50000.0000 [IU] | ORAL_CAPSULE | ORAL | 1 refills | Status: AC

## 2021-01-18 NOTE — Therapy (Signed)
[facility] [address] [place], [place], [address] Phone: [phone]   Fax:  [phone]  Physical Therapy Treatment  Patient Details  Name: Brooke [name] MRN: [id number] Date of Birth: [birth year] Referring Provider (PT): Theora MasterPotter, Zachary   Encounter Date: 01/18/2021   PT End of Session - 01/18/21 0941     Visit Number 7    Number of Visits 25    Date for PT Re-Evaluation 03/16/21    Authorization Type eval: 12/23/20    PT Start Time 0930    PT Stop Time 1015    PT Time Calculation (min) 45 min    Activity Tolerance Patient tolerated treatment well    Behavior During Therapy [facility] for tasks assessed/performed              Past Medical History:  Diagnosis Date   Anxiety    Concussion    Depression    Dyschromia    Head ache    Migraine     Past Surgical History:  Procedure Laterality Date   Thumb Surgery, Left     TONSILLECTOMY AND ADENOIDECTOMY      There were no vitals filed for this visit.   Subjective Assessment - 01/18/21 0936     Subjective Patient demonstrates excellent motivation during session today.  Once again her stability with head turns during gait is improving.  Introduced gaze stabilization exercises during forward and retroambulation which do increase patient's dizziness and blurred/double vision.  Also introduced isolated left shoulder strengthening during session today. Overall pt is making excellent progress.  She has an appointment this afternoon to establish with a new primary care provider.  No HEP progression provided on this date. Patient encouraged to follow-up scheduled.  She will benefit from skilled physical therapy services to address deficits in balance and coordination order to return to full function at home, work, and with leisure activities.    Pertinent History In April patient was hit in the back of her head by a dog. "Since then I have fallen and hit my head more than 20 times."  She has had persistent issues with her balance, falls, nausea, and emotional lability/blunted responses since the injury. "My emotions have been out of wack since this concussion." Denies suicidal or homicidal ideation. She has noted some mild improvement in her balance since onset. Initially she was having some speech difficult which has since resolved. Her nausea has persisted with decreased food intake resulting in 10-20 pound weight loss. She has tried Zofran and Phenergan without help. She was evaluated by gastroenterology who ordered an upper endoscopy however provider felt confident that her symptoms were not GI related. Pt has a prior history of migraines (2x/wk) but since the concussion has been having daily headaches. Headaches vary in location and present with light and sound sensitivity. Due to complaints of L peripheral visual deficit she was referred to neuro opthalmology who said that she had "perfect" peripheral vision" however she continues to struggle to see in her L peripheral visual field. She was initially seeing a "neuro chiropractor" after the injury but states that as her symptoms worsened they were adamant that she see a neurologist and have imaging. [facility] and [facility].  They have prescribed nortriptyline and recently the dose was increased. Pt denies any improvement since starting the medication but does complain of increased fatigue. She has had a head CT  and brain MRI which were both normal. Previous lumbar MRI 04/30/2019 was WNL. She hasn't been driving or working since the concussion and is currently unable to go outside and play with her dogs due to fear of hitting her head. She often wears a helmet at home to protect her head due to fear of falling. She also complains of fatigue which is limiting her activity level, however, she has been on two hikes since her concussion which was  encouraging. 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 She underwent LUE EMG on 11/30/20 which showed no electrodiagnostic evidence of a large fiber neuropathy or myopathy in the left upper extremity. See additional history below    Diagnostic tests See history    Patient Stated Goals Improve balance and decrease falls                   TREATMENT   Neuromuscular Re-education  SciFit L4 for warm-up during history x 7 minutes; VOR x 1 horizontal during forward and backward ambulation 70' x 3 each; Forward/retro gait in hallway with vertical ball tosses to self 70' x 2 each; Forward and retro gait in hallway with horizontal ball tosses to therapist 2 x 70' each direction with head/eye follow; Forward and retro gait in hallway with ball passes to therapist 2 x 70' each direction with head/eye follow;   Ther-ex  Seated L shoulder flexion, scaption, and abduction 2# dumbbell 2 x 10 each;  Pt educated throughout session about proper posture and technique with exercises. Improved exercise technique, movement at target joints, use of target muscles after min to mod verbal, visual, tactile cues.    Patient demonstrates excellent motivation during session today.  Once again her stability with head turns during gait is improving.  Introduced gaze stabilization exercises during forward and retroambulation which do increase patient's dizziness and blurred/double vision.  Also introduced isolated left shoulder strengthening during session today. Overall pt is making excellent progress.  She has an appointment this afternoon to establish with a new primary care provider.  No HEP progression provided on this date. Patient encouraged to follow-up scheduled.  She will benefit from skilled physical therapy services to address deficits in balance and coordination order to return to full function at home,  work, and with leisure activities.                         PT Short Term Goals - 12/23/20 1615       PT SHORT TERM GOAL #1   Title Pt will be independent with HEP in order to improve strength and balance in order to decrease fall risk and improve function at home and work.    Time 6    Period Weeks    Status New    Target Date 02/02/21               PT Long Term Goals - 12/31/20 1041       PT LONG TERM GOAL #1   Title Pt will improve BERG by at least 3 points in order to demonstrate clinically significant improvement in balance.    Baseline 12/22/20: To be completed at next session; 12/28/20: 50/56    Time 12    Period Weeks    Status New    Target Date 03/16/21      PT LONG TERM GOAL #2  Title Pt will improve FGA by at least 4 points in order to demonstrate clinically significant improvement in balance and decreased risk for falls.    Baseline 12/22/20: To be completed at next session; 12/28/20: 23/30    Time 12    Period Weeks    Status New    Target Date 03/16/21      PT LONG TERM GOAL #3   Title Pt will improve ABC by at least 13% in order to demonstrate clinically significant improvement in balance confidence    Baseline 12/22/20: To be completed at next session; 12/28/20: 55.6%    Time 12    Period Weeks    Status New    Target Date 03/16/21      PT LONG TERM GOAL #4   Title Pt will improve FOTO to at least 60 in order to demonstrate significant improvement in function related to balance deficits    Baseline 12/22/20: 53    Time 12    Period Weeks    Status New    Target Date 03/16/21                   Plan - 01/18/21 0942     Clinical Impression Statement Patient demonstrates excellent motivation during session today.  Once again her stability with head turns during gait is improving.  Introduced gaze stabilization exercises during forward and retroambulation which do increase patient's dizziness and blurred/double vision.   Overall pt is making excellent progress.  She has an appointment this afternoon to establish with a new primary care provider.  No HEP progression provided on this date. Patient encouraged to follow-up scheduled.  She will benefit from skilled physical therapy services to address deficits in balance and coordination order to return to full function at home, work, and with leisure activities.    Personal Factors and Comorbidities Comorbidity 3+    Comorbidities Amplified musculoskeletal pain syndrome, insomnia, depression, multiple concussion, migraines    Examination-Activity Limitations Caring for Others;Locomotion Level;Squat;Stand    Examination-Participation Restrictions Community Activity;Occupation;Other   Leisure activities (hiking)   Stability/Clinical Decision Making Unstable/Unpredictable    Rehab Potential Good    PT Frequency 2x / week    PT Duration 12 weeks    PT Treatment/Interventions ADLs/Self Care Home Management;Aquatic Therapy;Biofeedback;Canalith Repostioning;Cryotherapy;Electrical Stimulation;Iontophoresis 4mg /ml Dexamethasone;Moist Heat;Traction;Ultrasound;DME Instruction;Gait training;Stair training;Functional mobility training;Therapeutic activities;Therapeutic exercise;Balance training;Neuromuscular re-education;Cognitive remediation;Patient/family education;Manual techniques;Passive range of motion;Dry needling;Vestibular;Visual/perceptual remediation/compensation;Spinal Manipulations;Joint Manipulations    PT Next Visit Plan Progress balance exercises as well as habituation/adaptation    PT Home Exercise Plan Access Code: [email or web address]    Consulted and Agree with Plan of Care Patient;Other (Comment)   Girlfriend             Patient will benefit from skilled therapeutic intervention in order to improve the following deficits and impairments:  Decreased balance, Decreased activity tolerance, Other (comment) (Frequent falls)  Visit Diagnosis: Unsteadiness on  feet  History of falling     Problem List Patient Active Problem List   Diagnosis Date Noted   Moderate episode of recurrent major depressive disorder (HCC) 10/06/2020   History of 2019 novel coronavirus disease (COVID-19) 05/20/2020   ADHD 03/03/2019   Insomnia 12/20/2018   Vitamin D deficiency 03/20/2018   Migraine 02/09/2018   Depression 02/09/2018   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 [place], [place], [place] Phone:  [phone]   Fax:  [phone]  Name: Brooke [name] MRN: [id number] Date of Birth: 24-Jun-1995

## 2021-01-20 ENCOUNTER — Other Ambulatory Visit

## 2021-01-20 ENCOUNTER — Other Ambulatory Visit: Payer: Self-pay

## 2021-01-20 DIAGNOSIS — R200 Anesthesia of skin: Secondary | ICD-10-CM

## 2021-01-20 DIAGNOSIS — R21 Rash and other nonspecific skin eruption: Secondary | ICD-10-CM

## 2021-01-20 DIAGNOSIS — R202 Paresthesia of skin: Secondary | ICD-10-CM

## 2021-01-21 ENCOUNTER — Ambulatory Visit

## 2021-01-21 LAB — FERRITIN: Ferritin: 36 ng/mL (ref 15–150)

## 2021-01-21 LAB — TSH: TSH: 1.34 u[IU]/mL (ref 0.450–4.500)

## 2021-01-22 ENCOUNTER — Ambulatory Visit

## 2021-01-22 ENCOUNTER — Other Ambulatory Visit: Payer: Self-pay

## 2021-01-22 DIAGNOSIS — R2681 Unsteadiness on feet: Secondary | ICD-10-CM

## 2021-01-22 DIAGNOSIS — Z9181 History of falling: Secondary | ICD-10-CM

## 2021-01-22 LAB — ANA: Anti Nuclear Antibody (ANA): POSITIVE — AB

## 2021-01-22 LAB — COMPREHENSIVE METABOLIC PANEL
ALT: 10 IU/L (ref 0–32)
AST: 16 IU/L (ref 0–40)
Albumin/Globulin Ratio: 1.8 (ref 1.2–2.2)
Albumin: 4.7 g/dL (ref 3.9–5.0)
Alkaline Phosphatase: 85 IU/L (ref 44–121)
BUN/Creatinine Ratio: 9 (ref 9–23)
BUN: 9 mg/dL (ref 6–20)
Bilirubin Total: 0.2 mg/dL (ref 0.0–1.2)
CO2: 23 mmol/L (ref 20–29)
Calcium: 9.7 mg/dL (ref 8.7–10.2)
Chloride: 104 mmol/L (ref 96–106)
Creatinine, Ser: 0.96 mg/dL (ref 0.57–1.00)
Globulin, Total: 2.6 g/dL (ref 1.5–4.5)
Glucose: 91 mg/dL (ref 65–99)
Potassium: 3.7 mmol/L (ref 3.5–5.2)
Sodium: 140 mmol/L (ref 134–144)
Total Protein: 7.3 g/dL (ref 6.0–8.5)
eGFR: 86 mL/min/{1.73_m2} (ref >59)

## 2021-01-22 LAB — CBC WITH DIFFERENTIAL/PLATELET
Basophils Absolute: 0.1 10*3/uL (ref 0.0–0.2)
Basos: 1 %
EOS (ABSOLUTE): 0.3 10*3/uL (ref 0.0–0.4)
Eos: 4 %
Hematocrit: 42.8 % (ref 34.0–46.6)
Hemoglobin: 14.8 g/dL (ref 11.1–15.9)
Immature Grans (Abs): 0 10*3/uL (ref 0.0–0.1)
Immature Granulocytes: 0 %
Lymphocytes Absolute: 2.4 10*3/uL (ref 0.7–3.1)
Lymphs: 33 %
MCH: 30.2 pg (ref 26.6–33.0)
MCHC: 34.6 g/dL (ref 31.5–35.7)
MCV: 87 fL (ref 79–97)
Monocytes Absolute: 0.5 10*3/uL (ref 0.1–0.9)
Monocytes: 7 %
Neutrophils Absolute: 3.9 10*3/uL (ref 1.4–7.0)
Neutrophils: 55 %
Platelets: 358 10*3/uL (ref 150–450)
RBC: 4.9 x10E6/uL (ref 3.77–5.28)
RDW: 11.8 % (ref 11.7–15.4)
WBC: 7.1 10*3/uL (ref 3.4–10.8)

## 2021-01-22 LAB — LYME DISEASE, WESTERN BLOT
IgG P18 Ab.: ABSENT
IgG P23 Ab.: ABSENT
IgG P28 Ab.: ABSENT
IgG P30 Ab.: ABSENT
IgG P39 Ab.: ABSENT
IgG P45 Ab.: ABSENT
IgG P58 Ab.: ABSENT
IgG P66 Ab.: ABSENT
IgG P93 Ab.: ABSENT
IgM P23 Ab.: ABSENT
IgM P39 Ab.: ABSENT
IgM P41 Ab.: ABSENT
Lyme IgG Wb: NEGATIVE
Lyme IgM Wb: NEGATIVE

## 2021-01-22 LAB — VITAMIN D 25 HYDROXY (VIT D DEFICIENCY, FRACTURES): Vit D, 25-Hydroxy: 14.4 ng/mL — ABNORMAL LOW (ref 30.0–100.0)

## 2021-01-22 LAB — RA QN+CCP(IGG/A)+SJOSSA+SJOSSB
Cyclic Citrullin Peptide Ab: 6 units (ref 0–19)
ENA SSA (RO) Ab: <0.2 AI (ref 0.0–0.9)
ENA SSB (LA) Ab: 5.2 AI — ABNORMAL HIGH (ref 0.0–0.9)
Rheumatoid fact SerPl-aCnc: <10 IU/mL (ref <14.0)

## 2021-01-22 LAB — VITAMIN B12: Vitamin B-12: 385 pg/mL (ref 232–1245)

## 2021-01-22 LAB — ROCKY MTN SPOTTED FVR ABS PNL(IGG+IGM)
RMSF IgG: NEGATIVE
RMSF IgM: 1.45 index — ABNORMAL HIGH (ref 0.00–0.89)

## 2021-01-22 LAB — SEDIMENTATION RATE: Sed Rate: 2 mm/hr (ref 0–32)

## 2021-01-22 LAB — C-REACTIVE PROTEIN: CRP: 2 mg/L (ref 0–10)

## 2021-01-22 NOTE — Therapy (Signed)
[facility] [address] [place], [place], [address] Phone: [phone]   Fax:  [phone]  Physical Therapy Treatment  Patient Details  Name: Brooke [name] MRN: [id number] Date of Birth: [birth year] Referring Provider (PT): Theora Master   Encounter Date: 01/22/2021   PT End of Session - 01/22/21 1115     Visit Number 8    Number of Visits 25    Date for PT Re-Evaluation 03/16/21    Authorization Type eval: 12/23/20    PT Start Time 1100    PT Stop Time 1145    PT Time Calculation (min) 45 min    Activity Tolerance Patient tolerated treatment well    Behavior During Therapy [facility] for tasks assessed/performed               Past Medical History:  Diagnosis Date   Anxiety    Concussion    Depression    Dyschromia    Head ache    Migraine     Past Surgical History:  Procedure Laterality Date   Thumb Surgery, Left     TONSILLECTOMY AND ADENOIDECTOMY      There were no vitals filed for this visit.   Subjective Assessment - 01/22/21 1113     Subjective Pt reports that she is doing alright today.  She is complaining of a headache upon arrival.  She is also very fatigued as she had her first day at a new job yesterday.  She got her lab work back from her last primary care appointment but was unclear how to fully interpret the results.  She has reached out to her provider and is waiting for return communication.  She saw a new primary care physician the afternoon of her last therapy appointment and they are stopping a lot of her medications most notably the Seroquel in order to see if they were having any impact on her symptoms.  Since stopping the Seroquel she is having a very difficult time falling asleep at night.    Pertinent History In April patient was hit in the back of her head by a dog. "Since then I have fallen and hit my head more than 20 times." She has had persistent issues with her balance, falls, nausea, and  emotional lability/blunted responses since the injury. "My emotions have been out of wack since this concussion." Denies suicidal or homicidal ideation. She has noted some mild improvement in her balance since onset. Initially she was having some speech difficult which has since resolved. Her nausea has persisted with decreased food intake resulting in 10-20 pound weight loss. She has tried Zofran and Phenergan without help. She was evaluated by gastroenterology who ordered an upper endoscopy however provider felt confident that her symptoms were not GI related. Pt has a prior history of migraines (2x/wk) but since the concussion has been having daily headaches. Headaches vary in location and present with light and sound sensitivity. Due to complaints of L peripheral visual deficit she was referred to neuro opthalmology who said that she had "perfect" peripheral vision" however she continues to struggle to see in her L peripheral visual field. She was initially seeing a "neuro chiropractor" after the injury but states that as her symptoms worsened they were adamant that she see a neurologist and have imaging. She is currently being followed by [facility] neurology and has an appointment at [facility] neurology August 2nd, 2022.  They have prescribed nortriptyline and recently the dose was increased. Pt  denies any improvement since starting the medication but does complain of increased fatigue. She has had a head CT and brain MRI which were both normal. Previous lumbar MRI 04/30/2019 was WNL. She hasn't been driving or working since the concussion and is currently unable to go outside and play with her dogs due to fear of hitting her head. She often wears a helmet at home to protect her head due to fear of falling. She also complains of fatigue which is limiting her activity level, however, she has been on two hikes since her concussion which was encouraging. 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 She underwent LUE EMG on 11/30/20 which showed no electrodiagnostic evidence of a large fiber neuropathy or myopathy in the left upper extremity. See additional history below    Diagnostic tests See history    Patient Stated Goals Improve balance and decrease falls                   TREATMENT   Neuromuscular Re-education  VOR x 1 horizontal during forward and backward ambulation 70' x 3 each; Forward/retro gait in hallway with vertical ball tosses to self 70' x 2 each; Forward and retro gait in hallway with horizontal ball tosses to therapist 2 x 70' each direction with head/eye follow; Education about sleep hygiene with handout provided;   Ther-ex  NuStep L2 for warm-up during history x 10 minutes; Seated lat pull downs (handgrips) 60# 2 x 10; Seated rows (handgrips) 20# 2 x 10; Seated L shoulder flexion, scaption, and abduction 2# dumbbell x 10 each;   Pt educated throughout session about proper posture and technique with exercises. Improved exercise technique, movement at target joints, use of target muscles after min to mod verbal, visual, tactile cues.    Patient demonstrates excellent motivation during session today.  Continued gaze stabilization exercises during forward and retro ambulation. Also continued with isolated left shoulder strengthening during session today and additional exercises included today. Overall pt is making good progress with respect to her dizziness and activity tolerance.  No HEP progression provided on this date but will assess at next visit for changes. Patient encouraged to follow-up as scheduled.  She will benefit from skilled physical therapy services to address deficits in balance and coordination order to return to full function at home, work, and with leisure activities.                         PT Short Term Goals - 12/23/20 1615        PT SHORT TERM GOAL #1   Title Pt will be independent with HEP in order to improve strength and balance in order to decrease fall risk and improve function at home and work.    Time 6    Period Weeks    Status New    Target Date 02/02/21               PT Long Term Goals - 12/31/20 1041       PT LONG TERM GOAL #1   Title Pt will improve BERG by at least 3 points in order to demonstrate clinically significant improvement in balance.    Baseline 12/22/20: To be completed at next session; 12/28/20: 50/56    Time 12    Period Weeks    Status New    Target Date  03/16/21      PT LONG TERM GOAL #2   Title Pt will improve FGA by at least 4 points in order to demonstrate clinically significant improvement in balance and decreased risk for falls.    Baseline 12/22/20: To be completed at next session; 12/28/20: 23/30    Time 12    Period Weeks    Status New    Target Date 03/16/21      PT LONG TERM GOAL #3   Title Pt will improve ABC by at least 13% in order to demonstrate clinically significant improvement in balance confidence    Baseline 12/22/20: To be completed at next session; 12/28/20: 55.6%    Time 12    Period Weeks    Status New    Target Date 03/16/21      PT LONG TERM GOAL #4   Title Pt will improve FOTO to at least 60 in order to demonstrate significant improvement in function related to balance deficits    Baseline 12/22/20: 53    Time 12    Period Weeks    Status New    Target Date 03/16/21                   Plan - 01/22/21 1115     Clinical Impression Statement Patient demonstrates excellent motivation during session today.  Continued gaze stabilization exercises during forward and retro ambulation. Also continued with isolated left shoulder strengthening during session today and additional exercises included today. Overall pt is making good progress with respect to her dizziness and activity tolerance.  No HEP progression provided on this date but will  assess at next visit. Patient encouraged to follow-up as scheduled.  She will benefit from skilled physical therapy services to address deficits in balance and coordination order to return to full function at home, work, and with leisure activities.    Personal Factors and Comorbidities Comorbidity 3+    Comorbidities Amplified musculoskeletal pain syndrome, insomnia, depression, multiple concussion, migraines    Examination-Activity Limitations Caring for Others;Locomotion Level;Squat;Stand    Examination-Participation Restrictions Community Activity;Occupation;Other   Leisure activities (hiking)   Stability/Clinical Decision Making Unstable/Unpredictable    Rehab Potential Good    PT Frequency 2x / week    PT Duration 12 weeks    PT Treatment/Interventions ADLs/Self Care Home Management;Aquatic Therapy;Biofeedback;Canalith Repostioning;Cryotherapy;Electrical Stimulation;Iontophoresis 4mg /ml Dexamethasone;Moist Heat;Traction;Ultrasound;DME Instruction;Gait training;Stair training;Functional mobility training;Therapeutic activities;Therapeutic exercise;Balance training;Neuromuscular re-education;Cognitive remediation;Patient/family education;Manual techniques;Passive range of motion;Dry needling;Vestibular;Visual/perceptual remediation/compensation;Spinal Manipulations;Joint Manipulations    PT Next Visit Plan Progress balance exercises as well as habituation/adaptation    PT Home Exercise Plan Access Code: [email or web address]    Consulted and Agree with Plan of Care Patient;Other (Comment)   Girlfriend              Patient will benefit from skilled therapeutic intervention in order to improve the following deficits and impairments:  Decreased balance, Decreased activity tolerance, Other (comment) (Frequent falls)  Visit Diagnosis: Unsteadiness on feet  History of falling     Problem List Patient Active Problem List   Diagnosis Date Noted   Moderate episode of recurrent major depressive  disorder (HCC) 10/06/2020   History of 2019 novel coronavirus disease (COVID-19) 05/20/2020   ADHD 03/03/2019   Insomnia 12/20/2018   Vitamin D deficiency 03/20/2018   Migraine 02/09/2018   Depression 02/09/2018   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 [place], [place], [address] Phone: [phone]   Fax:  [phone]  Name: Brooke [name] MRN: [id number] Date of Birth: February 24, 1996

## 2021-01-23 ENCOUNTER — Other Ambulatory Visit: Payer: Self-pay | Admitting: Nurse Practitioner

## 2021-01-23 DIAGNOSIS — R768 Other specified abnormal immunological findings in serum: Secondary | ICD-10-CM

## 2021-01-23 MED ORDER — DOXYCYCLINE HYCLATE 100 MG PO TABS: 100.0000 mg | ORAL_TABLET | Freq: Two times a day (BID) | ORAL | 0 refills | Status: AC

## 2021-01-23 NOTE — Progress Notes (Signed)
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

## 2021-01-23 NOTE — Progress Notes (Signed)
Contacted via MyChart  Good morning Brooke [name], your labs have returned: [facility] Spotted Fever IgM is elevated.  Will send in Doxycycline to take for 7 days at this time.  ANA remains positive, this can happen with elevation in tick borne disease labs too, however ENA SSB Ab is also elevated -- which at times looks at Lupus and Sjogrens.  I am placing a rheumatology referral for further assessment.  Any questions?

## 2021-01-25 ENCOUNTER — Ambulatory Visit

## 2021-01-25 ENCOUNTER — Other Ambulatory Visit: Payer: Self-pay

## 2021-01-25 DIAGNOSIS — R2681 Unsteadiness on feet: Secondary | ICD-10-CM | POA: Diagnosis not present

## 2021-01-25 DIAGNOSIS — Z9181 History of falling: Secondary | ICD-10-CM

## 2021-01-25 NOTE — Therapy (Signed)
[place] [facility] [address] [place], [place], [address] Phone: [phone]   Fax:  [phone]  Physical Therapy Treatment  Patient Details  Name: Colette Ribasrianna Scotto MRN: [id number] Date of Birth: Aug 16, 1995 Referring Provider (PT): Theora MasterPotter, Zachary   Encounter Date: 01/25/2021   PT End of Session - 01/25/21 0855     Visit Number 9    Number of Visits 25    Date for PT Re-Evaluation 03/16/21    Authorization Type eval: 12/23/20    PT Start Time 0847    PT Stop Time 0930    PT Time Calculation (min) 43 min    Activity Tolerance Patient tolerated treatment well    Behavior During Therapy [facility] for tasks assessed/performed               Past Medical History:  Diagnosis Date   Anxiety    Concussion    Depression    Dyschromia    Head ache    Migraine     Past Surgical History:  Procedure Laterality Date   Thumb Surgery, Left     TONSILLECTOMY AND ADENOIDECTOMY      There were no vitals filed for this visit.   Subjective Assessment - 01/25/21 0851     Subjective Pt reports that she is doing alright today. She is tired because she only slept a couple hours last night. She states that the mirtazapine increases her nausea and she doesn't like the way it makes her feel. She has a call in to her provider and is waiting to hear back.    Pertinent History In April patient was hit in the back of her head by a dog. "Since then I have fallen and hit my head more than 20 times." She has had persistent issues with her balance, falls, nausea, and emotional lability/blunted responses since the injury. "My emotions have been out of wack since this concussion." Denies suicidal or homicidal ideation. She has noted some mild improvement in her balance since onset. Initially she was having some speech difficult which has since resolved. Her nausea has persisted with decreased food intake resulting in 10-20 pound weight loss. She has tried Zofran  and Phenergan without help. She was evaluated by gastroenterology who ordered an upper endoscopy however provider felt confident that her symptoms were not GI related. Pt has a prior history of migraines (2x/wk) but since the concussion has been having daily headaches. Headaches vary in location and present with light and sound sensitivity. Due to complaints of L peripheral visual deficit she was referred to neuro opthalmology who said that she had "perfect" peripheral vision" however she continues to struggle to see in her L peripheral visual field. She was initially seeing a "neuro chiropractor" after the injury but states that as her symptoms worsened they were adamant that she see a neurologist and have imaging. She is [facility] and [facility] August 2nd, 2022.  They have prescribed nortriptyline and recently the dose was increased. Pt denies any improvement since starting the medication but does complain of increased fatigue. She has had a head CT and brain MRI which were both normal. Previous lumbar MRI 04/30/2019 was WNL. She hasn't been driving or working since the concussion and is currently unable to go outside and play with her dogs due to fear of hitting her head. She often wears a helmet at home to protect her head due to fear of  falling. She also complains of fatigue which is limiting her activity level, however, she has been on two hikes since her concussion which was encouraging. 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 She underwent LUE EMG on 11/30/20 which showed no electrodiagnostic evidence of a large fiber neuropathy or myopathy in the left upper extremity. See additional history below    Diagnostic tests See history    Patient Stated Goals Improve balance and decrease falls                    TREATMENT   Neuromuscular Re-education  VOR x 1 horizontal during forward and backward ambulation 70' x 3 each; Forward/retro gait in hallway with vertical ball tosses to self with head/eye follow 2 x 70' each; Forward and retro gait in hallway with horizontal ball tosses to therapist with head/eye follow 2 x 70' each direction; Forward and retro gait in hallway with horizontal ball bounces to therapist with head/eye follow 2 x 70' each direction; Forward gait in hallway with head turns on command to read cards on wall 2 x 70'; Double body rolls on wall with eyes open x 1 each direction, eyes closed x 2 each direction (significant increase in dizziness and nausea); Brock string at 14", 4', 74' with 5s hold on each bead 2 x 60s;   Ther-ex  SciFit L4-L5 x 10 minutes during history; Seated lat pull downs (handgrips) 60# 2 x 15; Seated rows (handgrips) 20# 2 x 15;   Pt educated throughout session about proper posture and technique with exercises. Improved exercise technique, movement at target joints, use of target muscles after min to mod verbal, visual, tactile cues.    Patient demonstrates excellent motivation during session today.  Continued gaze stabilization exercises during forward and retro ambulation. Also continued with isolated left shoulder strengthening during session today. Reintroduced body rolls on wall and brock string convergence/divergence exercises. Overall pt is making good progress with respect to her dizziness and activity tolerance however remains significantly impacted by nausea which has been difficult to manage. No HEP progression provided on this date. Patient encouraged to follow-up as scheduled.  She will benefit from skilled physical therapy services to address deficits in balance and coordination order to return to full function at home, work, and with leisure activities.                         PT Short Term Goals - 12/23/20  1615       PT SHORT TERM GOAL #1   Title Pt will be independent with HEP in order to improve strength and balance in order to decrease fall risk and improve function at home and work.    Time 6    Period Weeks    Status New    Target Date 02/02/21               PT Long Term Goals - 12/31/20 1041       PT LONG TERM GOAL #1   Title Pt will improve BERG by at least 3 points in order to demonstrate clinically significant improvement in balance.    Baseline 12/22/20: To be completed at next session; 12/28/20: 50/56    Time 12    Period Weeks    Status New    Target Date 03/16/21      PT LONG TERM  GOAL #2   Title Pt will improve FGA by at least 4 points in order to demonstrate clinically significant improvement in balance and decreased risk for falls.    Baseline 12/22/20: To be completed at next session; 12/28/20: 23/30    Time 12    Period Weeks    Status New    Target Date 03/16/21      PT LONG TERM GOAL #3   Title Pt will improve ABC by at least 13% in order to demonstrate clinically significant improvement in balance confidence    Baseline 12/22/20: To be completed at next session; 12/28/20: 55.6%    Time 12    Period Weeks    Status New    Target Date 03/16/21      PT LONG TERM GOAL #4   Title Pt will improve FOTO to at least 60 in order to demonstrate significant improvement in function related to balance deficits    Baseline 12/22/20: 53    Time 12    Period Weeks    Status New    Target Date 03/16/21                   Plan - 01/25/21 0855     Clinical Impression Statement Patient demonstrates excellent motivation during session today.  Continued gaze stabilization exercises during forward and retro ambulation. Also continued with isolated left shoulder strengthening during session today. Reintroduced body rolls on wall and brock string convergence/divergence exercises. Overall pt is making good progress with respect to her dizziness and activity tolerance  however remains significantly impacted by nausea which has been difficult to manage. No HEP progression provided on this date. Patient encouraged to follow-up as scheduled.  She will benefit from skilled physical therapy services to address deficits in balance and coordination order to return to full function at home, work, and with leisure activities.    Personal Factors and Comorbidities Comorbidity 3+    Comorbidities Amplified musculoskeletal pain syndrome, insomnia, depression, multiple concussion, migraines    Examination-Activity Limitations Caring for Others;Locomotion Level;Squat;Stand    Examination-Participation Restrictions Community Activity;Occupation;Other   Leisure activities (hiking)   Stability/Clinical Decision Making Unstable/Unpredictable    Rehab Potential Good    PT Frequency 2x / week    PT Duration 12 weeks    PT Treatment/Interventions ADLs/Self Care Home Management;Aquatic Therapy;Biofeedback;Canalith Repostioning;Cryotherapy;Electrical Stimulation;Iontophoresis 4mg /ml Dexamethasone;Moist Heat;Traction;Ultrasound;DME Instruction;Gait training;Stair training;Functional mobility training;Therapeutic activities;Therapeutic exercise;Balance training;Neuromuscular re-education;Cognitive remediation;Patient/family education;Manual techniques;Passive range of motion;Dry needling;Vestibular;Visual/perceptual remediation/compensation;Spinal Manipulations;Joint Manipulations    PT Next Visit Plan Update outcome measures and goals, progress note, progress balance exercises as well as habituation/adaptation    PT Home Exercise Plan Access Code: [email or web address]    Consulted and Agree with Plan of Care Patient;Other (Comment)   Girlfriend              Patient will benefit from skilled therapeutic intervention in order to improve the following deficits and impairments:  Decreased balance, Decreased activity tolerance, Other (comment) (Frequent falls)  Visit Diagnosis: Unsteadiness on  feet  History of falling     Problem List Patient Active Problem List   Diagnosis Date Noted   Moderate episode of recurrent major depressive disorder (HCC) 10/06/2020   History of 2019 novel coronavirus disease (COVID-19) 05/20/2020   ADHD 03/03/2019   Insomnia 12/20/2018   Vitamin D deficiency 03/20/2018   Migraine 02/09/2018   Depression 02/09/2018   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 [place], [place], [address] Phone: [phone]   Fax:  [phone]  Name: Floy Angert MRN: [id number] Date of Birth: 02-19-1996

## 2021-01-27 ENCOUNTER — Other Ambulatory Visit: Payer: Self-pay

## 2021-01-27 ENCOUNTER — Ambulatory Visit

## 2021-01-27 DIAGNOSIS — R2681 Unsteadiness on feet: Secondary | ICD-10-CM | POA: Diagnosis not present

## 2021-01-27 DIAGNOSIS — Z9181 History of falling: Secondary | ICD-10-CM

## 2021-01-27 NOTE — Therapy (Signed)
[facility] [address] [place], [place], [address] Phone: [phone]   Fax:  [phone]  Physical Therapy Progress Note  Dates of reporting period  12/23/20   to   01/27/21  Patient Details  Name: Brooke [name] MRN: [id number] Date of Birth: 06-02-1996 Referring Provider (PT): Theora MasterPotter, Zachary   Encounter Date: 01/27/2021   PT End of Session - 01/27/21 0905     Visit Number 10    Number of Visits 25    Date for PT Re-Evaluation 03/16/21    Authorization Type eval: 12/23/20    PT Start Time 0845    PT Stop Time 0930    PT Time Calculation (min) 45 min    Activity Tolerance Patient tolerated treatment well    Behavior During Therapy [facility] for tasks assessed/performed               Past Medical History:  Diagnosis Date   Anxiety    Concussion    Depression    Dyschromia    Head ache    Migraine     Past Surgical History:  Procedure Laterality Date   Thumb Surgery, Left     TONSILLECTOMY AND ADENOIDECTOMY      There were no vitals filed for this visit.   Subjective Assessment - 01/27/21 0859     Subjective Pt reports that she is having a lot of neck and low back pain upon arrival. She rates her back pain as 6/10 currently. She continues to have diffiulty with her sleep. The mirtazapine is still making her feel unwell and she is planning to call her PCP to discuss possibly changing medications.    Pertinent History In April patient was hit in the back of her head by a dog. "Since then I have fallen and hit my head more than 20 times." She has had persistent issues with her balance, falls, nausea, and emotional lability/blunted responses since the injury. "My emotions have been out of wack since this concussion." Denies suicidal or homicidal ideation. She has noted some mild improvement in her balance since onset. Initially she was having some speech difficult which has since resolved. Her nausea has persisted with  decreased food intake resulting in 10-20 pound weight loss. She has tried Zofran and Phenergan without help. She was evaluated by gastroenterology who ordered an upper endoscopy however provider felt confident that her symptoms were not GI related. Pt has a prior history of migraines (2x/wk) but since the concussion has been having daily headaches. Headaches vary in location and present with light and sound sensitivity. Due to complaints of L peripheral visual deficit she was referred to neuro opthalmology who said that she had "perfect" peripheral vision" however she continues to struggle to see in her L peripheral visual field. She was initially seeing a "neuro chiropractor" after the injury but states that as her symptoms worsened they were adamant that she see a neurologist and have imaging. She [facility] and [facility].  They have prescribed nortriptyline and recently the dose was increased. Pt denies any improvement since starting the medication but does complain of increased fatigue. She has had a head CT and brain MRI which were both normal. Previous lumbar MRI 04/30/2019 was WNL. She hasn't been driving or working since the concussion and is currently unable to go outside and play with her dogs due to fear of hitting her head.  She often wears a helmet at home to protect her head due to fear of falling. She also complains of fatigue which is limiting her activity level, however, she has been on two hikes since her concussion which was encouraging. 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 She underwent LUE EMG on 11/30/20 which showed no electrodiagnostic evidence of a large fiber neuropathy or myopathy in the left upper extremity. See additional history below    Diagnostic tests See history    Patient Stated Goals  Improve balance and decrease falls                [facility] PT Assessment - 01/27/21 0913       Standardized Balance Assessment   Standardized Balance Assessment Berg Balance Test      Berg Balance Test   Sit to Stand Able to stand without using hands and stabilize independently    Standing Unsupported Able to stand safely 2 minutes    Sitting with Back Unsupported but Feet Supported on Floor or Stool Able to sit safely and securely 2 minutes    Stand to Sit Sits safely with minimal use of hands    Transfers Able to transfer safely, minor use of hands    Standing Unsupported with Eyes Closed Able to stand 10 seconds with supervision    Standing Unsupported with Feet Together Able to place feet together independently and stand 1 minute safely    From Standing, Reach Forward with Outstretched Arm Can reach confidently >25 cm (10")    From Standing Position, Pick up Object from Floor Able to pick up shoe safely and easily    From Standing Position, Turn to Look Behind Over each Shoulder Looks behind from both sides and weight shifts well    Turn 360 Degrees Able to turn 360 degrees safely in 4 seconds or less    Standing Unsupported, Alternately Place Feet on Step/Stool Able to stand independently and safely and complete 8 steps in 20 seconds    Standing Unsupported, One Foot in Front Able to plae foot ahead of the other independently and hold 30 seconds    Standing on One Leg Able to lift leg independently and hold > 10 seconds    Total Score 54      Functional Gait  Assessment   Gait assessed  Yes    Gait Level Surface Walks 20 ft in less than 5.5 sec, no assistive devices, good speed, no evidence for imbalance, normal gait pattern, deviates no more than 6 in outside of the 12 in walkway width.    Change in Gait Speed Able to smoothly change walking speed without loss of balance or gait deviation. Deviate no more than 6 in outside of the 12 in walkway width.    Gait with Horizontal  Head Turns Performs head turns smoothly with no change in gait. Deviates no more than 6 in outside 12 in walkway width    Gait with Vertical Head Turns Performs task with slight change in gait velocity (eg, minor disruption to smooth gait path), deviates 6 - 10 in outside 12 in walkway width or uses assistive device    Gait and Pivot Turn Pivot turns safely within 3 sec and stops quickly with no loss of balance.    Step Over Obstacle Is able to step over one shoe box (4.5 in total height) without changing gait speed.  No evidence of imbalance.    Gait with Narrow Base of Support Is able to ambulate for 10 steps heel to toe with no staggering.    Gait with Eyes Closed Walks 20 ft, uses assistive device, slower speed, mild gait deviations, deviates 6-10 in outside 12 in walkway width. Ambulates 20 ft in less than 9 sec but greater than 7 sec.    Ambulating Backwards Walks 20 ft, no assistive devices, good speed, no evidence for imbalance, normal gait    Steps Alternating feet, no rail.    Total Score 27               TREATMENT   Neuromuscular Re-education  Updated outcome measures and goals with patient: FGA: 27/30; BERG: 54/56 FOTO: 53; ABC: 76.9%   Ther-ex  SciFit L4-L5 x 5 minutes during history; Supine L shoulder flexion with 2# dumbbell, due to increase in pain regressed to no weight 2 x 10; Supine L shoulder serratus punch with 2# dumbbell 2 x 10; Supine L shoulder circles x 10 each direction; R sidelying L shoulder abduction 2 x 10; R sidelying L ER with 2# dumbbell 2 x 10;   Pt educated throughout session about proper posture and technique with exercises. Improved exercise technique, movement at target joints, use of target muscles after min to mod verbal, visual, tactile cues.    Patient demonstrates excellent motivation during session today.  Updated outcome measures and goals with patient during session today. Her BERG improved from 50/56 at initial evaluation to  54/56 today. Her FGA also improved from 23/30 at initial evaluation to 27/30 today.  Her ABC Scale score improved from 55.6% initial evaluation to 76.9% today. FOTO remains unchanged at 53.  Overall she is making excellent progress with physical therapy.  Due to increased levels of neck and low back pain upon arrival today performed mat table strengthening for LUE weakness. No HEP progression provided on this date. Patient encouraged to follow-up as scheduled.  She will benefit from skilled physical therapy services to address deficits in balance and coordination order to return to full function at home, work, and with leisure activities.                         PT Short Term Goals - 01/27/21 1416       PT SHORT TERM GOAL #1   Title Pt will be independent with HEP in order to improve strength and balance in order to decrease fall risk and improve function at home and work.    Time 6    Period Weeks    Status On-going    Target Date 02/02/21               PT Long Term Goals - 01/27/21 0906       PT LONG TERM GOAL #1   Title Pt will improve BERG by at least 3 points in order to demonstrate clinically significant improvement in balance.    Baseline 12/22/20: To be completed at next session; 12/28/20: 50/56; 01/27/21: 54/56    Time 12    Period Weeks    Status Achieved      PT LONG TERM GOAL #2   Title Pt will improve FGA by at least 4 points in order to demonstrate clinically significant improvement in balance and decreased risk for falls.    Baseline 12/22/20: To be completed at next session; 12/28/20: 23/30; 01/27/21: 27/30    Time 12  Period Weeks    Status Achieved      PT LONG TERM GOAL #3   Title Pt will improve ABC by at least 13% in order to demonstrate clinically significant improvement in balance confidence    Baseline 12/22/20: To be completed at next session; 12/28/20: 55.6%; 01/27/21: 76.9%    Time 12    Period Weeks    Status New      PT LONG TERM  GOAL #4   Title Pt will improve FOTO to at least 60 in order to demonstrate significant improvement in function related to balance deficits    Baseline 12/22/20: 53; 01/27/21: 53    Time 12    Period Weeks    Status On-going                   Plan - 01/27/21 0906     Clinical Impression Statement Patient demonstrates excellent motivation during session today.  Updated outcome measures and goals with patient during session today. Her BERG improved from 50/56 at initial evaluation to 54/56 today. Her FGA also improved from 23/30 at initial evaluation to 27/30 today.  Her ABC Scale score improved from 55.6% initial evaluation to 76.9% today. FOTO remains unchanged at 53.  Overall she is making excellent progress with physical therapy.  Due to increased levels of neck and low back pain upon arrival today performed mat table strengthening for LUE weakness. No HEP progression provided on this date. Patient encouraged to follow-up as scheduled.  She will benefit from skilled physical therapy services to address deficits in balance and coordination order to return to full function at home, work, and with leisure activities.    Personal Factors and Comorbidities Comorbidity 3+    Comorbidities Amplified musculoskeletal pain syndrome, insomnia, depression, multiple concussion, migraines    Examination-Activity Limitations Caring for Others;Locomotion Level;Squat;Stand    Examination-Participation Restrictions Community Activity;Occupation;Other   Leisure activities (hiking)   Stability/Clinical Decision Making Unstable/Unpredictable    Rehab Potential Good    PT Frequency 2x / week    PT Duration 12 weeks    PT Treatment/Interventions ADLs/Self Care Home Management;Aquatic Therapy;Biofeedback;Canalith Repostioning;Cryotherapy;Electrical Stimulation;Iontophoresis 4mg /ml Dexamethasone;Moist Heat;Traction;Ultrasound;DME Instruction;Gait training;Stair training;Functional mobility training;Therapeutic  activities;Therapeutic exercise;Balance training;Neuromuscular re-education;Cognitive remediation;Patient/family education;Manual techniques;Passive range of motion;Dry needling;Vestibular;Visual/perceptual remediation/compensation;Spinal Manipulations;Joint Manipulations    PT Next Visit Plan progress balance exercises as well as habituation/adaptation    PT Home Exercise Plan Access Code: [email or web address]    Consulted and Agree with Plan of Care Patient;Other (Comment)   Girlfriend              Patient will benefit from skilled therapeutic intervention in order to improve the following deficits and impairments:  Decreased balance, Decreased activity tolerance, Other (comment) (Frequent falls)  Visit Diagnosis: Unsteadiness on feet  History of falling     Problem List Patient Active Problem List   Diagnosis Date Noted   Moderate episode of recurrent major depressive disorder (HCC) 10/06/2020   History of 2019 novel coronavirus disease (COVID-19) 05/20/2020   ADHD 03/03/2019   Insomnia 12/20/2018   Vitamin D deficiency 03/20/2018   Migraine 02/09/2018   Depression 02/09/2018   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 [place], [place], [place] Phone: [phone]   Fax:  [phone]  Name: Brooke [name] MRN: Brooke [name] Date of Birth: 1995/08/05

## 2021-01-28 ENCOUNTER — Ambulatory Visit

## 2021-02-01 ENCOUNTER — Ambulatory Visit

## 2021-02-03 ENCOUNTER — Other Ambulatory Visit: Payer: Self-pay

## 2021-02-03 ENCOUNTER — Ambulatory Visit

## 2021-02-03 DIAGNOSIS — R110 Nausea: Secondary | ICD-10-CM | POA: Insufficient documentation

## 2021-02-03 DIAGNOSIS — Z9181 History of falling: Secondary | ICD-10-CM

## 2021-02-03 DIAGNOSIS — F0781 Postconcussional syndrome: Secondary | ICD-10-CM | POA: Insufficient documentation

## 2021-02-03 DIAGNOSIS — R519 Headache, unspecified: Secondary | ICD-10-CM | POA: Insufficient documentation

## 2021-02-03 DIAGNOSIS — R2681 Unsteadiness on feet: Secondary | ICD-10-CM

## 2021-02-03 DIAGNOSIS — M542 Cervicalgia: Secondary | ICD-10-CM | POA: Insufficient documentation

## 2021-02-03 NOTE — Therapy (Signed)
[facility] [address] [place], [place], [address] Phone: [phone]   Fax:  [phone]  Physical Therapy Treatment  Patient Details  Name: Brooke [name] MRN: [id number] Date of Birth: 04-Apr-1996 Referring Provider (PT): Theora Master   Encounter Date: 02/03/2021   PT End of Session - 02/03/21 1058     Visit Number 11    Number of Visits 25    Date for PT Re-Evaluation 03/16/21    Authorization Type eval: 12/23/20    PT Start Time 0845    PT Stop Time 0930    PT Time Calculation (min) 45 min    Activity Tolerance Patient tolerated treatment well    Behavior During Therapy [facility] for tasks assessed/performed                Past Medical History:  Diagnosis Date   Anxiety    Concussion    Depression    Dyschromia    Head ache    Migraine     Past Surgical History:  Procedure Laterality Date   Thumb Surgery, Left     TONSILLECTOMY AND ADENOIDECTOMY      There were no vitals filed for this visit.   Subjective Assessment - 02/03/21 0945     Subjective Pt reports that she is having a lot of LLE sciatic pain upon arrival. She has not noticed a lot of pain today in her LUE today. She has increased the frequency of times she is taking the trash out at work to decrease the weight of each lift she has to perform. She continues to have diffiulty with her sleep. She was advised by her PCP that she can stop the mirtazapine if she is having too many side effects but she has not stopped it yet. Pt has an appointment this afternoon with neurology and was able to get a referral to see rheumatology in September. Per pt genetics has refused her case and would not schedule her for an appointment. She has a phone call this afternoon with [facility] social work for medication management.    Pertinent History In April patient was hit in the back of her head by a dog. "Since then I have fallen and hit my head more than 20 times." She has had  persistent issues with her balance, falls, nausea, and emotional lability/blunted responses since the injury. "My emotions have been out of wack since this concussion." Denies suicidal or homicidal ideation. She has noted some mild improvement in her balance since onset. Initially she was having some speech difficult which has since resolved. Her nausea has persisted with decreased food intake resulting in 10-20 pound weight loss. She has tried Zofran and Phenergan without help. She was evaluated by gastroenterology who ordered an upper endoscopy however provider felt confident that her symptoms were not GI related. Pt has a prior history of migraines (2x/wk) but since the concussion has been having daily headaches. Headaches vary in location and present with light and sound sensitivity. Due to complaints of L peripheral visual deficit she was referred to neuro opthalmology who said that she had "perfect" peripheral vision" however she continues to struggle to see in her L peripheral visual field. She was initially seeing a "neuro chiropractor" after the injury but states that as her symptoms worsened they were adamant that she see a neurologist and have imaging. She is currently being followed by [facility] neurology and has an appointment at [facility] neurology August 2nd, 2022.  They have prescribed nortriptyline and recently the dose was increased. Pt denies any improvement since starting the medication but does complain of increased fatigue. She has had a head CT and brain MRI which were both normal. Previous lumbar MRI 04/30/2019 was WNL. She hasn't been driving or working since the concussion and is currently unable to go outside and play with her dogs due to fear of hitting her head. She often wears a helmet at home to protect her head due to fear of falling. She also complains of fatigue which is limiting her activity level, however, she has been on two hikes since her concussion which was encouraging. She  has a history of bulging discs in her cervical spine from prior soccer injuries and has been having some LUE weakness and paraesthesias. She underwent LUE EMG on 11/30/20 which showed no electrodiagnostic evidence of a large fiber neuropathy or myopathy in the left upper extremity. See additional history below    Diagnostic tests See history    Patient Stated Goals Improve balance and decrease falls                TREATMENT   Neuromuscular Re-education  Forward/retro gait in hallway with vertical ball tosses to self with head/eye follow 2 x 70' each; Forward and retro gait in hallway with horizontal ball tosses to therapist with head/eye follow 2 x 70' each direction; Forward and retro gait in hallway with horizontal ball bounces to therapist with head/eye follow x 70' each direction; Multicolored soccer ball kicks outside on grass, pt denies any increase in her dizziness;   Ther-ex  SciFit L4 x 8 minutes during history; Seated lat pull downs (bar) 60# 2 x 15; Seated rows (handgrips) 20# x 15, 30# 2 x 15; Seated bilateral shoulder flexion with 2# dumbbells 2 x 15 each;    Pt educated throughout session about proper posture and technique with exercises. Improved exercise technique, movement at target joints, use of target muscles after min to mod verbal, visual, tactile cues.    Patient demonstrates excellent motivation during session today. Continued with LUE strengthening during session today. Also continued with dynamic balance exercises with head turns and visual distraction. Pt is able to kick a multicolored soccer ball outside on the uneven grass without any evidence of instability or dizziness reported. Pt encouraged to follow-up as scheduled.  She will benefit from skilled physical therapy services to address deficits in balance and coordination order to return to full function at home, work, and with leisure activities.                         PT Short  Term Goals - 01/27/21 1416       PT SHORT TERM GOAL #1   Title Pt will be independent with HEP in order to improve strength and balance in order to decrease fall risk and improve function at home and work.    Time 6    Period Weeks    Status On-going    Target Date 02/02/21               PT Long Term Goals - 01/27/21 0906       PT LONG TERM GOAL #1   Title Pt will improve BERG by at least 3 points in order to demonstrate clinically significant improvement in balance.    Baseline 12/22/20: To be completed at next session; 12/28/20: 50/56; 01/27/21: 54/56    Time 12  Period Weeks    Status Achieved      PT LONG TERM GOAL #2   Title Pt will improve FGA by at least 4 points in order to demonstrate clinically significant improvement in balance and decreased risk for falls.    Baseline 12/22/20: To be completed at next session; 12/28/20: 23/30; 01/27/21: 27/30    Time 12    Period Weeks    Status Achieved      PT LONG TERM GOAL #3   Title Pt will improve ABC by at least 13% in order to demonstrate clinically significant improvement in balance confidence    Baseline 12/22/20: To be completed at next session; 12/28/20: 55.6%; 01/27/21: 76.9%    Time 12    Period Weeks    Status New      PT LONG TERM GOAL #4   Title Pt will improve FOTO to at least 60 in order to demonstrate significant improvement in function related to balance deficits    Baseline 12/22/20: 53; 01/27/21: 53    Time 12    Period Weeks    Status On-going                   Plan - 02/03/21 1058     Clinical Impression Statement Patient demonstrates excellent motivation during session today. Continued with LUE strengthening during session today. Also continued with dynamic balance exercises with head turns and visual distraction. Pt is able to kick a multicolored soccer ball outside on the uneven grass without any evidence of instability or dizziness reported. Pt encouraged to follow-up as scheduled.  She will  benefit from skilled physical therapy services to address deficits in balance and coordination order to return to full function at home, work, and with leisure activities.    Personal Factors and Comorbidities Comorbidity 3+    Comorbidities Amplified musculoskeletal pain syndrome, insomnia, depression, multiple concussion, migraines    Examination-Activity Limitations Caring for Others;Locomotion Level;Squat;Stand    Examination-Participation Restrictions Community Activity;Occupation;Other   Leisure activities (hiking)   Stability/Clinical Decision Making Unstable/Unpredictable    Rehab Potential Good    PT Frequency 2x / week    PT Duration 12 weeks    PT Treatment/Interventions ADLs/Self Care Home Management;Aquatic Therapy;Biofeedback;Canalith Repostioning;Cryotherapy;Electrical Stimulation;Iontophoresis 4mg /ml Dexamethasone;Moist Heat;Traction;Ultrasound;DME Instruction;Gait training;Stair training;Functional mobility training;Therapeutic activities;Therapeutic exercise;Balance training;Neuromuscular re-education;Cognitive remediation;Patient/family education;Manual techniques;Passive range of motion;Dry needling;Vestibular;Visual/perceptual remediation/compensation;Spinal Manipulations;Joint Manipulations    PT Next Visit Plan progress balance exercises as well as habituation/adaptation    PT Home Exercise Plan Access Code: [email or web address]    Consulted and Agree with Plan of Care Patient;Other (Comment)   Girlfriend               Patient will benefit from skilled therapeutic intervention in order to improve the following deficits and impairments:  Decreased balance, Decreased activity tolerance, Other (comment) (Frequent falls)  Visit Diagnosis: Unsteadiness on feet  History of falling     Problem List Patient Active Problem List   Diagnosis Date Noted   Moderate episode of recurrent major depressive disorder (HCC) 10/06/2020   History of 2019 novel coronavirus disease (COVID-19)  05/20/2020   ADHD 03/03/2019   Insomnia 12/20/2018   Vitamin D deficiency 03/20/2018   Migraine 02/09/2018   Depression 02/09/2018   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 [place], [place], [address] Phone: [phone]   Fax:  [phone]  Name:  Brooke [name] MRN: [id number] Date of Birth: 1996-03-04

## 2021-02-04 ENCOUNTER — Ambulatory Visit

## 2021-02-05 ENCOUNTER — Ambulatory Visit

## 2021-02-10 ENCOUNTER — Other Ambulatory Visit: Payer: Self-pay

## 2021-02-10 ENCOUNTER — Ambulatory Visit: Attending: Neurology

## 2021-02-10 DIAGNOSIS — R2681 Unsteadiness on feet: Secondary | ICD-10-CM

## 2021-02-10 DIAGNOSIS — Z9181 History of falling: Secondary | ICD-10-CM | POA: Diagnosis present

## 2021-02-10 NOTE — Therapy (Signed)
[facility] [address] [place], [place], [address] Phone: [phone]   Fax:  [phone]  Physical Therapy Treatment  Patient Details  Name: Brooke [name] MRN: [id number] Date of Birth: 01/03/1996 Referring Provider (PT): Theora Master   Encounter Date: 02/10/2021   PT End of Session - 02/10/21 1021     Visit Number 12    Number of Visits 25    Date for PT Re-Evaluation 03/16/21    Authorization Type eval: 12/23/20    PT Start Time 1017    PT Stop Time 1100    PT Time Calculation (min) 43 min    Activity Tolerance Patient tolerated treatment well    Behavior During Therapy [facility] for tasks assessed/performed                 Past Medical History:  Diagnosis Date   Anxiety    Concussion    Depression    Dyschromia    Head ache    Migraine     Past Surgical History:  Procedure Laterality Date   Thumb Surgery, Left     TONSILLECTOMY AND ADENOIDECTOMY      There were no vitals filed for this visit.   Subjective Assessment - 02/10/21 1020     Subjective Pt complains of bilateral shoulder pain upon arrival today. She rates her L shoulder pain as 6/10 upon arrival.  She saw neurology who encouraged her to continue with physical therapy and keep her appointments with rheumatology and the headache specialist. She had her appointment with behavioral health care management who encouraged stress reduction techniques such as progressive muscle relaxation exercises.  Pt has continued to taper off her Seroquel as instructed by her physician and is waiting for a return call regarding whether she should discontinue the Mirtazapine.    Pertinent History In April patient was hit in the back of her head by a dog. "Since then I have fallen and hit my head more than 20 times." She has had persistent issues with her balance, falls, nausea, and emotional lability/blunted responses since the injury. "My emotions have been out of wack  since this concussion." Denies suicidal or homicidal ideation. She has noted some mild improvement in her balance since onset. Initially she was having some speech difficult which has since resolved. Her nausea has persisted with decreased food intake resulting in 10-20 pound weight loss. She has tried Zofran and Phenergan without help. She was evaluated by gastroenterology who ordered an upper endoscopy however provider felt confident that her symptoms were not GI related. Pt has a prior history of migraines (2x/wk) but since the concussion has been having daily headaches. Headaches vary in location and present with light and sound sensitivity. Due to complaints of L peripheral visual deficit she was referred to neuro opthalmology who said that she had "perfect" peripheral vision" however she continues to struggle to see in her L peripheral visual field. She was initially seeing a "neuro chiropractor" after the injury but states that as her symptoms worsened they were adamant that she see a neurologist and have imaging. She is currently being followed by [facility] neurology and has an appointment at [facility] neurology August 2nd, 2022.  They have prescribed nortriptyline and recently the dose was increased. Pt denies any improvement since starting the medication but does complain of increased fatigue. She has had a head CT and brain MRI which were both normal. Previous lumbar MRI 04/30/2019 was WNL. She hasn't been driving  or working since the concussion and is currently unable to go outside and play with her dogs due to fear of hitting her head. She often wears a helmet at home to protect her head due to fear of falling. She also complains of fatigue which is limiting her activity level, however, she has been on two hikes since her concussion which was encouraging. 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 She  underwent LUE EMG on 11/30/20 which showed no electrodiagnostic evidence of a large fiber neuropathy or myopathy in the left upper extremity. See additional history below    Diagnostic tests See history    Patient Stated Goals Improve balance and decrease falls                TREATMENT   Ther-ex  UBE x 4 minutes (2 minutes forward/2 minutes backward) during interval history; Seated flexion with 2# dumbbells (DB) 2 x 10; Seated scaption with 2# DB 2 x 10; Seated lat pull downs (handgrips) 60# 2 x 10; Seated rows (handgrips) 40# 2 x 10; Diaphragmatic breathing education and practice performed with patient in hooklying position with knees resting on bolster; Progressed from diaphragmatic breathing to progressive muscle relaxation exercise x 10 minutes;   Manual Therapy  Supine CT junction thrust manipulation at T3 with patient bridging and multiple cavitations;   Pt educated throughout session about proper posture and technique with exercises. Improved exercise technique, movement at target joints, use of target muscles after min to mod verbal, visual, tactile cues.    Patient demonstrates excellent motivation during session today. Continued with LUE strengthening during session today. Education and practice with patient performing diaphragmatic breathing and pt taken through full progressive muscle relaxation program. She reports continued thoracic tension and due to psotive evidence surrounding CT junction manipulation and UE radicular symptoms performed grade V thrust manipulation to upper thoracic spine. Pt encouraged to follow-up as scheduled.  She will benefit from skilled physical therapy services to address deficits in balance and coordination order to return to full function at home, work, and with leisure activities.                         PT Short Term Goals - 01/27/21 1416       PT SHORT TERM GOAL #1   Title Pt will be independent with HEP in order  to improve strength and balance in order to decrease fall risk and improve function at home and work.    Time 6    Period Weeks    Status On-going    Target Date 02/02/21               PT Long Term Goals - 01/27/21 0906       PT LONG TERM GOAL #1   Title Pt will improve BERG by at least 3 points in order to demonstrate clinically significant improvement in balance.    Baseline 12/22/20: To be completed at next session; 12/28/20: 50/56; 01/27/21: 54/56    Time 12    Period Weeks    Status Achieved      PT LONG TERM GOAL #2   Title Pt will improve FGA by at least 4 points in order to demonstrate clinically significant improvement in balance and decreased risk for falls.    Baseline 12/22/20: To be completed at next session; 12/28/20: 23/30; 01/27/21: 27/30  Time 12    Period Weeks    Status Achieved      PT LONG TERM GOAL #3   Title Pt will improve ABC by at least 13% in order to demonstrate clinically significant improvement in balance confidence    Baseline 12/22/20: To be completed at next session; 12/28/20: 55.6%; 01/27/21: 76.9%    Time 12    Period Weeks    Status New      PT LONG TERM GOAL #4   Title Pt will improve FOTO to at least 60 in order to demonstrate significant improvement in function related to balance deficits    Baseline 12/22/20: 53; 01/27/21: 53    Time 12    Period Weeks    Status On-going                   Plan - 02/10/21 1022     Clinical Impression Statement Patient demonstrates excellent motivation during session today. Continued with LUE strengthening during session today. Education and practice with patient performing diaphragmatic breathing and pt taken through full progressive muscle relaxation program. She reports continued thoracic tension and due to psotive evidence surrounding CT junction manipulation and UE radicular symptoms performed grade V thrust manipulation to upper thoracic spine. Pt encouraged to follow-up as scheduled.  She  will benefit from skilled physical therapy services to address deficits in balance and coordination order to return to full function at home, work, and with leisure activities.    Personal Factors and Comorbidities Comorbidity 3+    Comorbidities Amplified musculoskeletal pain syndrome, insomnia, depression, multiple concussion, migraines    Examination-Activity Limitations Caring for Others;Locomotion Level;Squat;Stand    Examination-Participation Restrictions Community Activity;Occupation;Other   Leisure activities (hiking)   Stability/Clinical Decision Making Unstable/Unpredictable    Rehab Potential Good    PT Frequency 2x / week    PT Duration 12 weeks    PT Treatment/Interventions ADLs/Self Care Home Management;Aquatic Therapy;Biofeedback;Canalith Repostioning;Cryotherapy;Electrical Stimulation;Iontophoresis 4mg /ml Dexamethasone;Moist Heat;Traction;Ultrasound;DME Instruction;Gait training;Stair training;Functional mobility training;Therapeutic activities;Therapeutic exercise;Balance training;Neuromuscular re-education;Cognitive remediation;Patient/family education;Manual techniques;Passive range of motion;Dry needling;Vestibular;Visual/perceptual remediation/compensation;Spinal Manipulations;Joint Manipulations    PT Next Visit Plan progress balance exercises as well as habituation/adaptation    PT Home Exercise Plan Access Code: [email or web address]    Consulted and Agree with Plan of Care Patient;Other (Comment)   Girlfriend                Patient will benefit from skilled therapeutic intervention in order to improve the following deficits and impairments:  Decreased balance, Decreased activity tolerance, Other (comment) (Frequent falls)  Visit Diagnosis: Unsteadiness on feet  History of falling     Problem List Patient Active Problem List   Diagnosis Date Noted   Moderate episode of recurrent major depressive disorder (HCC) 10/06/2020   History of 2019 novel coronavirus disease  (COVID-19) 05/20/2020   ADHD 03/03/2019   Insomnia 12/20/2018   Vitamin D deficiency 03/20/2018   Migraine 02/09/2018   Depression 02/09/2018   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 [place], [place], [place] Phone: [phone]   Fax:  [phone]  Name: Addisen Chappelle MRN: Colette Ribas Date of Birth: 09-Dec-1995

## 2021-02-11 NOTE — Progress Notes (Signed)
Office Visit Note  Patient: Brooke [name]             Date of Birth: [birth year]           MRN: [id number]             PCP: Reyes Ivan, MD Referring: Venita Lick, NP Visit Date: 02/23/2021 Occupation: _0 @  Subjective:  Positive ANA, and joint pain.   History of Present Illness: Brooke [name] is a 25 y.o. female seen in consultation per request of her PCP.  She was accompanied by her [place].according the patient she has had generalized pain since she was a child.  The pain got worse while she was in the high school.  She was seen at [facility] rheumatology where she was diagnosed amplified musculoskeletal pain.  She states during the college she was in the soccer team and had a concussion injury.  She was withdrawn from the college because of the injury.  She gradually recovered and started working as a Charity fundraiser.  She states that 13 September 2020 she was hit by a dog on the back of her head.  She started having headaches and neck pain.  She also started experiencing balance issue and speech issues.  She was evaluated by neuro chiropractor who gave her some strengthening exercises.  She states after that she fell in her pond and hit her head to the edge of the bone.  Due to her balance issues she had fallen 15 times and hit her head.  3 weeks later she went to see the chiropractor he was very much concerned about her balance issue and advised neurology referral.  She had a neurology evaluation at clinical clinic and also at [facility].  She states she had extensive work-up including MRI of her brain and MRI of her C-spine.  MRI of her brain was negative per patient.  The C-spine showed some degenerative changes.  She was referred to a physiatrist to advise seeing a neurologist again due to left-sided weakness.  The neurologist did not find left-sided weakness.  She went to see her PCP who evaluated her and did extensive lab work.  She was found to have vitamin D deficiency and  she was placed on vitamin D 50,000 units/week.  She also had positive ANA for that reason she was referred to me.  There is no history of dry eyes.  She gives history of dry mouth for many years.  She also gives history of oral herpes ulcers.  She has shortness of breath which she relates to smoking.  There is no history of malar rash, photosensitivity, lymphadenopathy or Raynaud's phenomenon.  She continues to have generalized pain in her joints and her muscles.  There is no history of joint swelling.  There is history of fibromyalgia in her mother.  She is gravida 0, para 0, there is no history of DVTs.  Activities of Daily Living:  Patient reports morning stiffness for all day. Patient Reports nocturnal pain.  Difficulty dressing/grooming: Denies Difficulty climbing stairs: Denies Difficulty getting out of chair: Denies Difficulty using hands for taps, buttons, cutlery, and/or writing: Denies  Review of Systems  Constitutional:  Positive for fatigue. Negative for night sweats, weight gain and weight loss.  HENT:  Positive for mouth sores and mouth dryness. Negative for trouble swallowing, trouble swallowing and nose dryness.        H/o oral herpes  Eyes:  Negative for pain, redness, itching, visual disturbance and dryness.  Respiratory:  Positive for shortness of breath and difficulty breathing. Negative for cough.        H/o  smoking  Cardiovascular:  Negative for chest pain, palpitations, hypertension, irregular heartbeat and swelling in legs/feet.  Gastrointestinal:  Negative for blood in stool, constipation and diarrhea.  Endocrine: Negative for increased urination.  Genitourinary:  Negative for difficulty urinating and vaginal dryness.  Musculoskeletal:  Positive for joint pain, joint pain, myalgias, morning stiffness, muscle tenderness and myalgias. Negative for joint swelling and muscle weakness.  Skin:  Negative for color change, rash, hair loss, redness, skin tightness, ulcers and  sensitivity to sunlight.  Allergic/Immunologic: Positive for susceptible to infections.  Neurological:  Positive for dizziness, numbness, headaches, memory loss and weakness. Negative for night sweats.  Hematological:  Positive for bruising/bleeding tendency. Negative for swollen glands.  Psychiatric/Behavioral:  Positive for depressed mood and sleep disturbance. The patient is nervous/anxious.    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    Past Medical History:  Diagnosis Date   Anxiety    Concussion    Depression    Dyschromia    Head ache    Migraine     Family History  Problem Relation Age of Onset   Chronic fatigue Mother    Fibromyalgia Mother    Thyroid disease Mother    Congenital heart disease Mother    Prostate cancer Father    Heart murmur Brother    Autism Brother    Asthma Brother    Autism Brother    Asperger's syndrome Brother    Hypertension Maternal Grandmother    Hyperlipidemia Maternal Grandmother    Cancer Maternal Grandfather    Past Surgical History:  Procedure Laterality Date   Thumb Surgery, Left     TONSILLECTOMY AND ADENOIDECTOMY     Social History   Social History Narrative   Pt states she is now having sexual intercourse with men    Immunization History  Administered Date(s) Administered   DTaP 07/03/1998, 09/05/1998, 11/09/1998, 10/28/1999, 08/09/2002   HPV 9-valent 07/14/2014   HPV Quadrivalent 12/28/2010, 03/16/2011   Hepatitis A, Adult 10/04/2001, 08/09/2002   Hepatitis B, adult 07/03/1998, 09/18/1998, 05/06/1999   HiB (PRP-OMP) 07/03/1998, 09/18/1998, 05/06/1999   IPV 07/03/1998, 11/09/1998,  08/09/2002   Influenza,inj,Quad PF,6+ Mos 03/20/2018, 03/11/2019   MMR 05/06/1999, 08/09/2002   Meningococcal B, OMV 09/27/2017   Meningococcal Conjugate 10/27/2009, 09/08/2016   Tdap 06/21/2009, 09/27/2017   Varicella 03/02/2000, 11/15/2006     Objective: Vital Signs: BP 125/72 (BP Location: Left Arm, Patient Position: Sitting, Cuff Size: Normal)   Pulse 96   Ht 5' 2.5" (1.588 m)   Wt 157 lb (71.2 kg)   LMP 01/15/2021   BMI 28.26 kg/m    Physical Exam Vitals and nursing note reviewed.  Constitutional:      Appearance: She is well-developed.  HENT:     Head: Normocephalic and atraumatic.  Eyes:     Conjunctiva/sclera: Conjunctivae normal.  Cardiovascular:     Rate and Rhythm: Normal rate and regular rhythm.     Heart sounds: Normal heart sounds.  Pulmonary:     Effort: Pulmonary effort is normal.     Breath  sounds: Normal breath sounds.  Abdominal:     General: Bowel sounds are normal.     Palpations: Abdomen is soft.  Musculoskeletal:     Cervical back: Normal range of motion.  Lymphadenopathy:     Cervical: No cervical adenopathy.  Skin:    General: Skin is warm and dry.     Capillary Refill: Capillary refill takes less than 2 seconds.     Comments: Skin laxity was noted.  Neurological:     Mental Status: She is alert and oriented to person, place, and time.  Psychiatric:        Behavior: Behavior normal.     Musculoskeletal Exam: She had discomfort with range of motion of her cervical spine especially with lateral rotation and flexion.  Shoulder joints, elbow joints, wrist joints, MCPs PIPs and DIPs with good range of motion.  She had hypermobility in her elbows and wrist joints.  She had no synovitis over MCPs PIPs or DIPs.  She had hypermobility in her bilateral hip joints and her knee joints.  She had patellar laxity.  She had bilateral bunion formation and pes planus.  She was able to walk on her tiptoes and and on her heels.  She had no difficulty getting up  from the squatting position.  CDAI Exam: CDAI Score: -- Patient Global: --; Provider Global: -- Swollen: --; Tender: -- Joint Exam 02/23/2021   No joint exam has been documented for this visit   There is currently no information documented on the homunculus. Go to the Rheumatology activity and complete the homunculus joint exam.  Investigation: No additional findings.  Imaging: No results found.  Recent Labs: Lab Results  Component Value Date   WBC 7.1 01/15/2021   HGB 14.8 01/15/2021   PLT 358 01/15/2021   NA 140 01/15/2021   K 3.7 01/15/2021   CL 104 01/15/2021   CO2 23 01/15/2021   GLUCOSE 91 01/15/2021   BUN 9 01/15/2021   CREATININE 0.96 01/15/2021   BILITOT 0.2 01/15/2021   ALKPHOS 85 01/15/2021   AST 16 01/15/2021   ALT 10 01/15/2021   PROT 7.3 01/15/2021   ALBUMIN 4.7 01/15/2021   CALCIUM 9.7 01/15/2021    Speciality Comments: No specialty comments available.  Procedures:  No procedures performed Allergies: Sulfa antibiotics and Wellbutrin [bupropion]   Assessment / Plan:     Visit Diagnoses: Positive ANA (antinuclear antibody) - 01/15/21: ANA+, ESR 2, CRP 2, anti-CCP 6, La 5.2, Ro-, RF<10, Lyme ab-, RMSF IgM 1.45, vitamin b12 385, Vitamin D 14.4. 01/20/21: TSH 1.34, ferritin 36.  She has positive ANA with no titer given and positive La antibody.  None of the other antibodies are positive.  She gives history of dry mouth for many years which could be related to medication use.  She also smokes.  She gives history of oral ulcers related to oral herpes.  She gives history of shortness of breath due to vaping and previous smoking.  There is no history of malar rash, Raynaud's, joint swelling, lymphadenopathy or photosensitivity.  There is no family history of autoimmune disease.  I will obtain AVISE labs.  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  I am suspicious that she may have Ehlers-Danlos syndrome.  She  has laxity of her skin as well.  I will refer her to sports medicine, Dr. Tamala Julian.  Patient states that she had an eye evaluation which was normal.  She may need cardiology evaluation as  well.  She will contact her PCP for cardiology referral.  She would benefit from isometric exercises.  I will have her discuss this further with Dr. Tamala Julian.  Amplified musculoskeletal pain syndrome-patient was diagnosed with amplified musculoskeletal pain by [facility] rheumatology in the past.  She has generalized hyperalgesia, positive tender points and history of fatigue for many years.  She meets the criteria for fibromyalgia syndrome.  Need for  regular exercise was emphasized.  DDD (degenerative disc disease), cervical-patient states that she has had concussion injuries and neck injury in the past.  Her last MRI was reviewed which showed degenerative changes.  Pes planus of both feet-she complains of bilateral feet discomfort.  She has pes planus  and bunion formation.  She would benefit from using proper fitting shoes with arch support.  Vitamin D deficiency-she was recently diagnosed with vitamin D deficiency.  Vitamin D deficiency can cause myalgias and fatigue as well.  She is on vitamin D supplement.  Attention deficit hyperactivity disorder (ADHD), predominantly inattentive type  Moderate episode of recurrent major depressive disorder (HCC)-she is on nortriptyline.  Hx of migraines-she has been treated with Topamax and gabapentin.  Other insomnia-sleep hygiene was discussed.  History of 2019 novel coronavirus disease (COVID-19) - 2022  Family history of fibromyalgia - Mother  Orders: No orders of the defined types were placed in this encounter.  No orders of the defined types were placed in this encounter.    Follow-Up Instructions: Return for +ANA.   Bo Merino, MD  Note - This record has been created using Editor, commissioning.  Chart creation errors have been sought, but may not always  have  been located. Such creation errors do not reflect on  the standard of medical care.

## 2021-02-12 ENCOUNTER — Ambulatory Visit

## 2021-02-23 ENCOUNTER — Ambulatory Visit (INDEPENDENT_AMBULATORY_CARE_PROVIDER_SITE_OTHER): Admitting: Rheumatology

## 2021-02-23 ENCOUNTER — Other Ambulatory Visit: Payer: Self-pay

## 2021-02-23 ENCOUNTER — Encounter: Payer: Self-pay | Admitting: Rheumatology

## 2021-02-23 VITALS — BP 125/72 | HR 96 | Ht 62.5 in | Wt 157.0 lb

## 2021-02-23 DIAGNOSIS — M2141 Flat foot [pes planus] (acquired), right foot: Secondary | ICD-10-CM

## 2021-02-23 DIAGNOSIS — G4709 Other insomnia: Secondary | ICD-10-CM

## 2021-02-23 DIAGNOSIS — Z8669 Personal history of other diseases of the nervous system and sense organs: Secondary | ICD-10-CM

## 2021-02-23 DIAGNOSIS — M2550 Pain in unspecified joint: Secondary | ICD-10-CM

## 2021-02-23 DIAGNOSIS — Z8269 Family history of other diseases of the musculoskeletal system and connective tissue: Secondary | ICD-10-CM

## 2021-02-23 DIAGNOSIS — M2142 Flat foot [pes planus] (acquired), left foot: Secondary | ICD-10-CM

## 2021-02-23 DIAGNOSIS — R768 Other specified abnormal immunological findings in serum: Secondary | ICD-10-CM | POA: Diagnosis not present

## 2021-02-23 DIAGNOSIS — F900 Attention-deficit hyperactivity disorder, predominantly inattentive type: Secondary | ICD-10-CM

## 2021-02-23 DIAGNOSIS — F331 Major depressive disorder, recurrent, moderate: Secondary | ICD-10-CM

## 2021-02-23 DIAGNOSIS — M5030 Other cervical disc degeneration, unspecified cervical region: Secondary | ICD-10-CM

## 2021-02-23 DIAGNOSIS — M7918 Myalgia, other site: Secondary | ICD-10-CM

## 2021-02-23 DIAGNOSIS — E559 Vitamin D deficiency, unspecified: Secondary | ICD-10-CM

## 2021-02-23 DIAGNOSIS — Z8616 Personal history of COVID-19: Secondary | ICD-10-CM

## 2021-02-25 HISTORY — PX: UPPER GI ENDOSCOPY: SHX6162

## 2021-03-01 ENCOUNTER — Ambulatory Visit

## 2021-03-01 ENCOUNTER — Other Ambulatory Visit: Payer: Self-pay

## 2021-03-01 DIAGNOSIS — R2681 Unsteadiness on feet: Secondary | ICD-10-CM | POA: Diagnosis not present

## 2021-03-01 NOTE — Therapy (Signed)
[facility] [address] [place], [place], [address] Phone: [phone]   Fax:  [phone]  Physical Therapy Treatment  Patient Details  Name: Vernisha Bacote MRN: [id number] Date of Birth: 07-22-1995 Referring Provider (PT): Theora Master   Encounter Date: 03/01/2021   PT End of Session - 03/01/21 1329     Visit Number 13    Number of Visits 25    Date for PT Re-Evaluation 03/16/21    Authorization Type eval: 12/23/20    PT Start Time 1317    PT Stop Time 1400    PT Time Calculation (min) 43 min    Activity Tolerance Patient tolerated treatment well    Behavior During Therapy [facility] for tasks assessed/performed                 Past Medical History:  Diagnosis Date   Anxiety    Concussion    Depression    Dyschromia    Head ache    Migraine     Past Surgical History:  Procedure Laterality Date   Thumb Surgery, Left     TONSILLECTOMY AND ADENOIDECTOMY      There were no vitals filed for this visit.   Subjective Assessment - 03/01/21 1323     Subjective Pt reports that her neck has been bothering her recently. She currently rates her neck pain as 6-7/10. She saw rheumatology who was concerned that she might have EDS and pt was referred to a sports medicine doctor. She has an upcoming appointment with a headache specialist in November. She spoke with social work this morning and she thinks that she will likely stop taking her mirtazapine. She still has some mild unsteadiness if she climbs on a ladder but overall feels like the dizziness has resolved. She is now completely off the Seroquel.    Pertinent History In April patient was hit in the back of her head by a dog. "Since then I have fallen and hit my head more than 20 times." She has had persistent issues with her balance, falls, nausea, and emotional lability/blunted responses since the injury. "My emotions have been out of wack since this concussion." Denies  suicidal or homicidal ideation. She has noted some mild improvement in her balance since onset. Initially she was having some speech difficult which has since resolved. Her nausea has persisted with decreased food intake resulting in 10-20 pound weight loss. She has tried Zofran and Phenergan without help. She was evaluated by gastroenterology who ordered an upper endoscopy however provider felt confident that her symptoms were not GI related. Pt has a prior history of migraines (2x/wk) but since the concussion has been having daily headaches. Headaches vary in location and present with light and sound sensitivity. Due to complaints of L peripheral visual deficit she was referred to neuro opthalmology who said that she had "perfect" peripheral vision" however she continues to struggle to see in her L peripheral visual field. She was initially seeing a "neuro chiropractor" after the injury but states that as her symptoms worsened they were adamant that she see a neurologist and have imaging. She is currently being followed by [facility] neurology and has an appointment at [facility] neurology August 2nd, 2022.  They have prescribed nortriptyline and recently the dose was increased. Pt denies any improvement since starting the medication but does complain of increased fatigue. She has had a head CT and brain MRI which were both normal. Previous lumbar MRI 04/30/2019 was  WNL. She hasn't been driving or working since the concussion and is currently unable to go outside and play with her dogs due to fear of hitting her head. She often wears a helmet at home to protect her head due to fear of falling. She also complains of fatigue which is limiting her activity level, however, she has been on two hikes since her concussion which was encouraging. 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 She underwent LUE EMG on 11/30/20 which  showed no electrodiagnostic evidence of a large fiber neuropathy or myopathy in the left upper extremity. See additional history below    Diagnostic tests See history    Patient Stated Goals Improve balance and decrease falls                TREATMENT   Ther-ex  NuStep L2 x 10 minutes during interval history; Nautilus seated lat pull downs (handgrips) 50# x 20, 60# x 10; Nautilus seated rows (handgrips) 40# x 20, 50# x 10; Nautilus standing chest press (handgrips) 40# x 15, x 10; Nautilus shoulder extension from 90 flexion to waist 30# x 10,  Seated overhead 4# dumbbell (DB) press 2 x 15;  Seated flexion with 4# DB 2 x 10; Seated scaption with 2# DB 2 x 10;   Pt educated throughout session about proper posture and technique with exercises. Improved exercise technique, movement at target joints, use of target muscles after min to mod verbal, visual, tactile cues.    Patient demonstrates excellent motivation during session today. Continued with LUE strengthening and patient is demonstrating significant improvement in left upper extremity strength.  She is able to make significant progress today with increased resistance on the Nautilus machine as well as with dumbbell exercises.  Pt encouraged to follow-up as scheduled.  She will benefit from skilled physical therapy services to address deficits in balance and coordination order to return to full function at home, work, and with leisure activities.                         PT Short Term Goals - 01/27/21 1416       PT SHORT TERM GOAL #1   Title Pt will be independent with HEP in order to improve strength and balance in order to decrease fall risk and improve function at home and work.    Time 6    Period Weeks    Status On-going    Target Date 02/02/21               PT Long Term Goals - 01/27/21 0906       PT LONG TERM GOAL #1   Title Pt will improve BERG by at least 3 points in order to  demonstrate clinically significant improvement in balance.    Baseline 12/22/20: To be completed at next session; 12/28/20: 50/56; 01/27/21: 54/56    Time 12    Period Weeks    Status Achieved      PT LONG TERM GOAL #2   Title Pt will improve FGA by at least 4 points in order to demonstrate clinically significant improvement in balance and decreased risk for falls.    Baseline 12/22/20: To be completed at next session; 12/28/20: 23/30; 01/27/21: 27/30    Time 12    Period Weeks    Status Achieved      PT  LONG TERM GOAL #3   Title Pt will improve ABC by at least 13% in order to demonstrate clinically significant improvement in balance confidence    Baseline 12/22/20: To be completed at next session; 12/28/20: 55.6%; 01/27/21: 76.9%    Time 12    Period Weeks    Status New      PT LONG TERM GOAL #4   Title Pt will improve FOTO to at least 60 in order to demonstrate significant improvement in function related to balance deficits    Baseline 12/22/20: 53; 01/27/21: 53    Time 12    Period Weeks    Status On-going                   Plan - 03/01/21 1329     Clinical Impression Statement Patient demonstrates excellent motivation during session today. Continued with LUE strengthening and patient is demonstrating significant improvement in left upper extremity strength.  She is able to make significant progress today with increased resistance on the Nautilus machine as well as with dumbbell exercises.  Pt encouraged to follow-up as scheduled.  She will benefit from skilled physical therapy services to address deficits in balance and coordination order to return to full function at home, work, and with leisure activities.    Personal Factors and Comorbidities Comorbidity 3+    Comorbidities Amplified musculoskeletal pain syndrome, insomnia, depression, multiple concussion, migraines    Examination-Activity Limitations Caring for Others;Locomotion Level;Squat;Stand    Examination-Participation  Restrictions Community Activity;Occupation;Other   Leisure activities (hiking)   Stability/Clinical Decision Making Unstable/Unpredictable    Rehab Potential Good    PT Frequency 2x / week    PT Duration 12 weeks    PT Treatment/Interventions ADLs/Self Care Home Management;Aquatic Therapy;Biofeedback;Canalith Repostioning;Cryotherapy;Electrical Stimulation;Iontophoresis 4mg /ml Dexamethasone;Moist Heat;Traction;Ultrasound;DME Instruction;Gait training;Stair training;Functional mobility training;Therapeutic activities;Therapeutic exercise;Balance training;Neuromuscular re-education;Cognitive remediation;Patient/family education;Manual techniques;Passive range of motion;Dry needling;Vestibular;Visual/perceptual remediation/compensation;Spinal Manipulations;Joint Manipulations    PT Next Visit Plan progress balance exercises as well as habituation/adaptation    PT Home Exercise Plan Access Code: [email or web address]    Consulted and Agree with Plan of Care Patient;Other (Comment)   Girlfriend                Patient will benefit from skilled therapeutic intervention in order to improve the following deficits and impairments:  Decreased balance, Decreased activity tolerance, Other (comment) (Frequent falls)  Visit Diagnosis: Unsteadiness on feet     Problem List Patient Active Problem List   Diagnosis Date Noted   Moderate episode of recurrent major depressive disorder (HCC) 10/06/2020   History of 2019 novel coronavirus disease (COVID-19) 05/20/2020   ADHD 03/03/2019   Insomnia 12/20/2018   Vitamin D deficiency 03/20/2018   Migraine 02/09/2018   Depression 02/09/2018   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 [place], [place], [place] Phone: [phone]   Fax:  [phone]  Name: Kendalynn Wideman MRN: Colette Ribas Date of Birth:  [birth year]

## 2021-03-03 ENCOUNTER — Ambulatory Visit

## 2021-03-03 ENCOUNTER — Encounter: Payer: Self-pay | Admitting: Rheumatology

## 2021-03-05 DIAGNOSIS — F3341 Major depressive disorder, recurrent, in partial remission: Secondary | ICD-10-CM | POA: Diagnosis not present

## 2021-03-13 NOTE — Progress Notes (Deleted)
Office Visit Note  Patient: Brooke [name]             Date of Birth: January 03, 1996           MRN: [id number]             PCP: Reyes Ivan, MD Referring: Jon Billings, NP Visit Date: 03/26/2021 Occupation: @GUAROCC @  Subjective:  No chief complaint on file.   History of Present Illness: Brooke [name] is a 25 y.o. female ***   Activities of Daily Living:  Patient reports morning stiffness for *** {minute/hour:19697}.   Patient {ACTIONS;DENIES/REPORTS:21021675::"Denies"} nocturnal pain.  Difficulty dressing/grooming: {ACTIONS;DENIES/REPORTS:21021675::"Denies"} Difficulty climbing stairs: {ACTIONS;DENIES/REPORTS:21021675::"Denies"} Difficulty getting out of chair: {ACTIONS;DENIES/REPORTS:21021675::"Denies"} Difficulty using hands for taps, buttons, cutlery, and/or writing: {ACTIONS;DENIES/REPORTS:21021675::"Denies"}  No Rheumatology ROS completed.   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    Past Medical History:  Diagnosis Date   Anxiety    Concussion    Depression    Dyschromia    Head ache    Migraine     Family History  Problem Relation Age of Onset   Chronic fatigue Mother    Fibromyalgia Mother    Thyroid disease Mother    Congenital heart disease Mother    Prostate cancer Father    Heart murmur Brother    Autism Brother    Asthma Brother    Autism Brother    Asperger's syndrome Brother    Hypertension Maternal Grandmother    Hyperlipidemia Maternal Grandmother    Cancer Maternal Grandfather    Past Surgical History:  Procedure Laterality Date   Thumb Surgery, Left     TONSILLECTOMY AND  ADENOIDECTOMY     Social History   Social History Narrative   Pt states she is now having sexual intercourse with men    Immunization History  Administered Date(s) Administered   DTaP 07/03/1998, 09/05/1998, 11/09/1998, 10/28/1999, 08/09/2002   HPV 9-valent 07/14/2014   HPV Quadrivalent 12/28/2010, 03/16/2011   Hepatitis A, Adult 10/04/2001, 08/09/2002   Hepatitis B, adult 07/03/1998, 09/18/1998, 05/06/1999   HiB (PRP-OMP) 07/03/1998, 09/18/1998, 05/06/1999   IPV 07/03/1998, 11/09/1998, 08/09/2002   Influenza,inj,Quad PF,6+ Mos 03/20/2018, 03/11/2019   MMR 05/06/1999, 08/09/2002   Meningococcal B, OMV 09/27/2017   Meningococcal Conjugate 10/27/2009, 09/08/2016   Tdap 06/21/2009, 09/27/2017   Varicella 03/02/2000, 11/15/2006     Objective: Vital Signs: There were no vitals taken for this visit.   Physical Exam   Musculoskeletal Exam: ***  CDAI Exam: CDAI Score: -- Patient Global: --; Provider Global: -- Swollen: --; Tender: -- Joint Exam 03/26/2021   No joint exam has been documented for this visit   There is currently no information documented on the homunculus. Go to the Rheumatology activity and complete the homunculus joint exam.  Investigation: No additional findings.  Imaging: No results found.  Recent Labs: Lab Results  Component Value Date   WBC 7.1 01/15/2021   HGB 14.8 01/15/2021   PLT 358 01/15/2021   NA 140 01/15/2021   K 3.7 01/15/2021   CL 104 01/15/2021   CO2 23 01/15/2021   GLUCOSE 91 01/15/2021   BUN 9 01/15/2021   CREATININE 0.96 01/15/2021   BILITOT  0.2 01/15/2021   ALKPHOS 85 01/15/2021   AST 16 01/15/2021   ALT 10 01/15/2021   PROT 7.3 01/15/2021   ALBUMIN 4.7 01/15/2021   CALCIUM 9.7 01/15/2021   01/15/21: ANA+, ESR 2, CRP 2, anti-CCP 6, La 5.2, Ro-, RF<10, Lyme ab-, RMSF IgM 1.45, vitamin b12 385, Vitamin D 14.4.  01/20/21: TSH 1.34, ferritin 36.     Speciality Comments: No specialty comments available.  Procedures:  No  procedures performed Allergies: Sulfa antibiotics and Wellbutrin [bupropion]   Assessment / Plan:     Visit Diagnoses: No diagnosis found.  Orders: No orders of the defined types were placed in this encounter.  No orders of the defined types were placed in this encounter.   Face-to-face time spent with patient was *** minutes. Greater than 50% of time was spent in counseling and coordination of care.  Follow-Up Instructions: No follow-ups on file.   Bo Merino, MD  Note - This record has been created using Editor, commissioning.  Chart creation errors have been sought, but may not always  have been located. Such creation errors do not reflect on  the standard of medical care.

## 2021-03-18 NOTE — Progress Notes (Deleted)
[facility] [address] [place] [address] Phone: [phone] Subjective:    I'm seeing this patient by the request  of:  Dorthey Sawyer, MD  CC: pain everywhere   [id number]  Brooke [name] is a 25 y.o. female coming in with complaint of hypermobility.  Onset-  Location Duration-  Character- Aggravating factors- Reliving factors-  Therapies tried-  Severity-    Patient did have an MRI of the cervical spine done in July of this year.  Patient did have a very tiny disc protrusion noted at the C5-C6 area that could potentially be causing a left-sided C6 nerve impingement.  She also has another disc protrusion at C3-C4 affecting the right C4 nerve root.  MRI of the lumbar spine was independently visualized by me from 2020 that showed no significant bony abnormality.  Reviewing patient's labs patient did have elevated Past Medical History:  Diagnosis Date   Anxiety    Concussion    Depression    Dyschromia    Head ache    Migraine    Past Surgical History:  Procedure Laterality Date   Thumb Surgery, Left     TONSILLECTOMY AND ADENOIDECTOMY     Social History   Socioeconomic History   Marital status: Single    Spouse name: Not on file   Number of children: Not on file   Years of education: Not on file   Highest education level: Not on file  Occupational History   Not on file  Tobacco Use   Smoking status: Former   Smokeless tobacco: Never  Vaping Use   Vaping Use: Every day  Substance and Sexual Activity   Alcohol use: Yes    Comment: occ   Drug use: Yes    Types: Marijuana   Sexual activity: Yes  Other Topics Concern   Not on file  Social History Narrative   Pt states she is now having sexual intercourse with men    Social Determinants of Health   Financial Resource Strain: Not on file  Food Insecurity: Not on file  Transportation Needs: Not on file  Physical Activity: Not on file  Stress: Not on  file  Social Connections: Not on file   Allergies  Allergen Reactions   Sulfa Antibiotics    Wellbutrin [Bupropion] Anxiety   Family History  Problem Relation Age of Onset   Chronic fatigue Mother    Fibromyalgia Mother    Thyroid disease Mother    Congenital heart disease Mother    Prostate cancer Father    Heart murmur Brother    Autism Brother    Asthma Brother    Autism Brother    Asperger's syndrome Brother    Hypertension Maternal Grandmother    Hyperlipidemia Maternal Grandmother    Cancer Maternal Grandfather       Current Outpatient Medications (Respiratory):    promethazine (PHENERGAN) 25 MG suppository, Place 1 suppository (25 mg total) rectally every 6 (six) hours as needed for nausea or vomiting. (Patient not taking: Reported on 02/23/2021)  Current Outpatient Medications (Analgesics):    acetaminophen (TYLENOL) 325 MG tablet, Take by mouth as needed.   Erenumab-aooe (AIMOVIG) 140 MG/ML SOAJ,    naproxen (NAPROSYN) 500 MG tablet, Take 1 tablet (500 mg total) by mouth 2 (two) times daily with a meal. (Patient taking differently: Take 500 mg by mouth as needed.)   rizatriptan (MAXALT) 10 MG tablet, Take 1 tab at first sign of migraine. May repeat in  2 hours if needed   Current Outpatient Medications (Other):    gabapentin (NEURONTIN) 300 MG capsule, Take 1 capsule (300 mg total) by mouth 3 (three) times daily as needed.   mirtazapine (REMERON) 15 MG tablet, Take 15 mg by mouth at bedtime.   nortriptyline (PAMELOR) 50 MG capsule,    ondansetron (ZOFRAN) 4 MG tablet,    QUEtiapine (SEROQUEL) 200 MG tablet, Take one tablet by mouth at bedtime, may take additional tablet as needed for sleep. (Patient not taking: Reported on 02/23/2021)   topiramate (TOPAMAX) 100 MG tablet, TAKE 1 TABLET(100 MG) BY MOUTH TWICE DAILY (Patient taking differently: daily.)   valACYclovir (VALTREX) 1000 MG tablet, Take 1 tablet (1,000 mg total) by mouth 2 (two) times daily. (Patient taking  differently: Take 1,000 mg by mouth as needed.)   Vitamin D, Ergocalciferol, (DRISDOL) 1.25 MG (50000 UNIT) CAPS capsule, Take 1 capsule (50,000 Units total) by mouth every 7 (seven) days.   Reviewed prior external information including notes and imaging from  primary care provider As well as notes that were available from care everywhere and other healthcare systems.  Past medical history, social, surgical and family history all reviewed in electronic medical record.  No pertanent information unless stated regarding to the chief complaint.   Review of Systems:  No headache, visual changes, nausea, vomiting, diarrhea, constipation, dizziness, abdominal pain, skin rash, fevers, chills, night sweats, weight loss, swollen lymph nodes, body aches, joint swelling, chest pain, shortness of breath, mood changes. POSITIVE muscle aches  Objective  There were no vitals taken for this visit.   General: No apparent distress alert and oriented x3 mood and affect normal, dressed appropriately.  HEENT: Pupils equal, extraocular movements intact  Respiratory: Patient's speak in full sentences and does not appear short of breath  Cardiovascular: No lower extremity edema, non tender, no erythema  Gait normal with good balance and coordination.  MSK:  Non tender with full range of motion and good stability and symmetric strength and tone of shoulders, elbows, wrist, hip, knee and ankles bilaterally.     Impression and Recommendations:     The above documentation has been reviewed and is accurate and complete Doristine Bosworth

## 2021-03-23 ENCOUNTER — Ambulatory Visit: Admitting: Family Medicine

## 2021-03-26 ENCOUNTER — Ambulatory Visit: Admitting: Rheumatology

## 2021-03-26 DIAGNOSIS — M7918 Myalgia, other site: Secondary | ICD-10-CM

## 2021-03-26 DIAGNOSIS — G4709 Other insomnia: Secondary | ICD-10-CM

## 2021-03-26 DIAGNOSIS — F900 Attention-deficit hyperactivity disorder, predominantly inattentive type: Secondary | ICD-10-CM

## 2021-03-26 DIAGNOSIS — M5030 Other cervical disc degeneration, unspecified cervical region: Secondary | ICD-10-CM

## 2021-03-26 DIAGNOSIS — Z8269 Family history of other diseases of the musculoskeletal system and connective tissue: Secondary | ICD-10-CM

## 2021-03-26 DIAGNOSIS — R768 Other specified abnormal immunological findings in serum: Secondary | ICD-10-CM

## 2021-03-26 DIAGNOSIS — F331 Major depressive disorder, recurrent, moderate: Secondary | ICD-10-CM

## 2021-03-26 DIAGNOSIS — M2550 Pain in unspecified joint: Secondary | ICD-10-CM

## 2021-03-26 DIAGNOSIS — E559 Vitamin D deficiency, unspecified: Secondary | ICD-10-CM

## 2021-03-26 DIAGNOSIS — Z8669 Personal history of other diseases of the nervous system and sense organs: Secondary | ICD-10-CM

## 2021-03-26 DIAGNOSIS — M2141 Flat foot [pes planus] (acquired), right foot: Secondary | ICD-10-CM

## 2021-04-04 NOTE — Progress Notes (Signed)
Office Visit Note  Patient: Brooke [name]             Date of Birth: [birth year]           MRN: [id number]             PCP: Reyes Ivan, MD Referring: Jon Billings, NP Visit Date: 04/15/2021 Occupation: _0 @  Subjective:  Other (Patient reports recent fall and now has increased upper back pain )   History of Present Illness: Brooke [name] is a 25 y.o. female returns today for the evaluation of positive ANA.  She states she has been experiencing dry mouth and dry eyes.  She also is very anxious and experiencing palpitations and shortness of breath with anxiety.  She has been playing  games to help her anxiety.  She continues to have neck and lower back pain.  She has to lift heavy objects at work.  She missed her appointment with the sports medicine but it is scheduled next week.  Activities of Daily Living:  Patient reports morning stiffness for all day. Patient Reports nocturnal pain.  Difficulty dressing/grooming: Denies Difficulty climbing stairs: Denies Difficulty getting out of chair: Denies Difficulty using hands for taps, buttons, cutlery, and/or writing: Denies  Review of Systems  Constitutional:  Positive for fatigue.  HENT:  Positive for mouth sores and mouth dryness. Negative for nose dryness.   Eyes:  Negative for itching and dryness.  Respiratory:  Positive for shortness of breath. Negative for difficulty breathing.   Cardiovascular:  Positive for palpitations. Negative for chest pain.  Gastrointestinal:  Positive for constipation and diarrhea. Negative for blood in stool.  Endocrine: Negative for increased urination.  Genitourinary:  Negative for difficulty urinating.  Musculoskeletal:  Positive for joint pain, joint pain, myalgias, morning stiffness, muscle tenderness and myalgias. Negative for joint swelling.  Skin:  Negative for color change, rash, redness and sensitivity to sunlight.  Allergic/Immunologic: Negative for susceptible to  infections.  Neurological:  Positive for dizziness, headaches and weakness.  Hematological:  Positive for bruising/bleeding tendency.  Psychiatric/Behavioral:  Negative for depressed mood, confusion and sleep disturbance. The patient is nervous/anxious.    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    Past Medical History:  Diagnosis Date   Anxiety    Concussion    Depression    Dyschromia    Head ache    Migraine     Family History  Problem Relation Age of Onset   Chronic fatigue Mother    Fibromyalgia Mother    Thyroid disease Mother    Congenital heart disease Mother    Prostate cancer Father    Heart murmur Brother    Autism Brother    Asthma Brother    Autism Brother    Asperger's syndrome Brother    Hypertension Maternal Grandmother    Hyperlipidemia Maternal Grandmother    Cancer Maternal Grandfather    Past Surgical History:  Procedure Laterality Date   Thumb Surgery, Left     TONSILLECTOMY AND ADENOIDECTOMY     UPPER GI ENDOSCOPY  02/25/2021   Social History   Social History Narrative   Pt states she is now having sexual intercourse  with men    Immunization History  Administered Date(s) Administered   DTaP 07/03/1998, 09/05/1998, 11/09/1998, 10/28/1999, 08/09/2002   HPV 9-valent 07/14/2014   HPV Quadrivalent 12/28/2010, 03/16/2011   Hepatitis A, Adult 10/04/2001, 08/09/2002   Hepatitis B, adult 07/03/1998, 09/18/1998, 05/06/1999   HiB (PRP-OMP) 07/03/1998, 09/18/1998, 05/06/1999   IPV 07/03/1998, 11/09/1998, 08/09/2002   Influenza,inj,Quad PF,6+ Mos 03/20/2018, 03/11/2019   MMR 05/06/1999, 08/09/2002    Meningococcal B, OMV 09/27/2017   Meningococcal Conjugate 10/27/2009, 09/08/2016   Tdap 06/21/2009, 09/27/2017   Varicella 03/02/2000, 11/15/2006     Objective: Vital Signs: BP 123/75 (BP Location: Left Arm, Patient Position: Sitting, Cuff Size: Normal)   Pulse 83   Ht 5' 1.5" (1.562 m)   Wt 154 lb 12.8 oz (70.2 kg)   BMI 28.78 kg/m    Physical Exam Vitals and nursing note reviewed.  Constitutional:      Appearance: She is well-developed.  HENT:     Head: Normocephalic and atraumatic.  Eyes:     Conjunctiva/sclera: Conjunctivae normal.  Cardiovascular:     Rate and Rhythm: Normal rate and regular rhythm.     Heart sounds: Normal heart sounds.  Pulmonary:     Effort: Pulmonary effort is normal.     Breath sounds: Normal breath sounds.  Abdominal:     General: Bowel sounds are normal.     Palpations: Abdomen is soft.  Musculoskeletal:     Cervical back: Normal range of motion.  Lymphadenopathy:     Cervical: No cervical adenopathy.  Skin:    General: Skin is warm and dry.     Capillary Refill: Capillary refill takes less than 2 seconds.  Neurological:     Mental Status: She is alert and oriented to person, place, and time.  Psychiatric:        Behavior: Behavior normal.     Musculoskeletal Exam: She has stiffness and discomfort range of motion of her cervical lumbar spine.  She good mobility.  Shoulder joints, elbow joints, wrist joints, MCPs PIPs and DIPs with good range of motion with no synovitis.  Hip joints, knee joints, ankles, MTPs and PIPs with good range of motion with no synovitis.  CDAI Exam: CDAI Score: -- Patient Global: --; Provider Global: -- Swollen: --; Tender: -- Joint Exam 04/15/2021   No joint exam has been documented for this visit   There is currently no information documented on the homunculus. Go to the Rheumatology activity and complete the homunculus joint exam.  Investigation: No additional findings.  Imaging: No results  found.  Recent Labs: Lab Results  Component Value Date   WBC 7.1 01/15/2021   HGB 14.8 01/15/2021   PLT 358 01/15/2021   NA 140 01/15/2021   K 3.7 01/15/2021   CL 104 01/15/2021   CO2 23 01/15/2021   GLUCOSE 91 01/15/2021   BUN 9 01/15/2021   CREATININE 0.96 01/15/2021   BILITOT 0.2 01/15/2021   ALKPHOS 85 01/15/2021   AST 16 01/15/2021   ALT 10 01/15/2021   PROT 7.3 01/15/2021   ALBUMIN 4.7 01/15/2021   CALCIUM 9.7 01/15/2021    01/15/21: ANA+, ESR 2, CRP 2, anti-CCP 6, La 5.2, Ro-, RF<10, Lyme ab-, RMSF IgM 1.45, vitamin b12 385, Vitamin D 14.4. 01/20/21: TSH 1.34, ferritin 36.   March 03, 2021 AVISE lupus index -3.7, ANA negative, ENA negative except SS B antibody 16, CB CAP negative, anticardiolipin IgM weak positive, beta-2 GP 1 negative, RF negative, anti-CCP negative, Jo 1 negative, thyroglobulin negative,  TPO negative  Speciality Comments: No specialty comments available.  Procedures:  No procedures performed Allergies: Sulfa antibiotics and Wellbutrin [bupropion]   Assessment / Plan:     Visit Diagnoses: Positive ANA (antinuclear antibody) - AVISE lupus index -3.7, positive SSB antibody and positive anticardiolipin IgM feet positive.  Left findings were discussed at length.  Repeat ANA is negative.  She gives, history of dry mouth for many years.  She is on multiple medications which could be contributing to dry mouth and dry eyes.  I will repeat SSB antibody in the future and also anticardiolipin antibody.  She gives history of oral herpes.  Shortness of breath related to vaping and previous smoking. - Plan: Sjogrens syndrome-B extractable nuclear antibody, Cardiolipin antibodies, IgG, IgM, IgA  Sicca complex (HCC)-she has dry mouth and dry eyes.  Most likely related to the medication use.  Over-the-counter products were discussed.  Use of humidifier was discussed.  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  She  was referred to sports medicine.  Cardiology and ophthalmology evaluation advised.  Amplified musculoskeletal pain syndrome - Diagnosed at [facility] rheumatology many years ago due to hyperalgesia, positive tender points and fatigue.  She has features of fibromyalgia syndrome.  Pes planus of both feet - And bilateral bunions were noted.  Orthotics were advised.  DDD (degenerative disc disease), cervical - History of injury in the past.  MRI showed degenerative changes.  I gave her a handout on neck exercises.  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  She states she has been going to a Restaurant manager, fast food.  Give her a handout on back exercises.  Vitamin D deficiency - She is on vitamin D.  Moderate episode of recurrent major depressive disorder ([place]) - Treated with nortriptyline.  She gives history of anxiety.  She also complains of palpitations and shortness of breath.  She has an appointment coming up with her PCP.  I advised her to get a baseline EKG.  Her chest was clear to auscultation.  Hx of migraines - She is on Topamax and gabapentin.  Other insomnia  Attention deficit hyperactivity disorder (ADHD), predominantly inattentive type  Family history of fibromyalgia - In her mother.  Orders: Orders Placed This Encounter  Procedures   Sjogrens syndrome-B extractable nuclear antibody   Cardiolipin antibodies, IgG, IgM, IgA    No orders of the defined types were placed in this encounter.    Follow-Up Instructions: Return in about 3 months (around 07/16/2021).   Bo Merino, MD  Note - This record has been created using Editor, commissioning.  Chart creation errors have been sought, but may not always  have been located. Such creation errors do not reflect on  the standard of medical care.

## 2021-04-13 ENCOUNTER — Encounter: Payer: Self-pay | Admitting: Rheumatology

## 2021-04-14 DIAGNOSIS — Z0289 Encounter for other administrative examinations: Secondary | ICD-10-CM | POA: Insufficient documentation

## 2021-04-15 ENCOUNTER — Ambulatory Visit (INDEPENDENT_AMBULATORY_CARE_PROVIDER_SITE_OTHER): Admitting: Rheumatology

## 2021-04-15 ENCOUNTER — Other Ambulatory Visit: Payer: Self-pay

## 2021-04-15 ENCOUNTER — Encounter: Payer: Self-pay | Admitting: Rheumatology

## 2021-04-15 VITALS — BP 123/75 | HR 83 | Ht 61.5 in | Wt 154.8 lb

## 2021-04-15 DIAGNOSIS — F331 Major depressive disorder, recurrent, moderate: Secondary | ICD-10-CM | POA: Diagnosis not present

## 2021-04-15 DIAGNOSIS — M7918 Myalgia, other site: Secondary | ICD-10-CM

## 2021-04-15 DIAGNOSIS — M5030 Other cervical disc degeneration, unspecified cervical region: Secondary | ICD-10-CM | POA: Diagnosis not present

## 2021-04-15 DIAGNOSIS — M3500 Sicca syndrome, unspecified: Secondary | ICD-10-CM | POA: Diagnosis not present

## 2021-04-15 DIAGNOSIS — F900 Attention-deficit hyperactivity disorder, predominantly inattentive type: Secondary | ICD-10-CM

## 2021-04-15 DIAGNOSIS — R768 Other specified abnormal immunological findings in serum: Secondary | ICD-10-CM | POA: Diagnosis not present

## 2021-04-15 DIAGNOSIS — M545 Low back pain, unspecified: Secondary | ICD-10-CM

## 2021-04-15 DIAGNOSIS — G4709 Other insomnia: Secondary | ICD-10-CM

## 2021-04-15 DIAGNOSIS — M2141 Flat foot [pes planus] (acquired), right foot: Secondary | ICD-10-CM

## 2021-04-15 DIAGNOSIS — Z8269 Family history of other diseases of the musculoskeletal system and connective tissue: Secondary | ICD-10-CM

## 2021-04-15 DIAGNOSIS — E559 Vitamin D deficiency, unspecified: Secondary | ICD-10-CM | POA: Diagnosis not present

## 2021-04-15 DIAGNOSIS — Z8669 Personal history of other diseases of the nervous system and sense organs: Secondary | ICD-10-CM

## 2021-04-15 DIAGNOSIS — M2550 Pain in unspecified joint: Secondary | ICD-10-CM

## 2021-04-15 DIAGNOSIS — G8929 Other chronic pain: Secondary | ICD-10-CM

## 2021-04-15 DIAGNOSIS — M2142 Flat foot [pes planus] (acquired), left foot: Secondary | ICD-10-CM

## 2021-04-15 NOTE — Patient Instructions (Signed)
Back Exercises The following exercises strengthen the muscles that help to support the trunk (torso) and back. They also help to keep the lower back flexible. Doing these exercises can help to prevent or lessen existing low back pain. If you have back pain or discomfort, try doing these exercises 2-3 times each day or as told by your health care provider. As your pain improves, do them once each day, but increase the number of times that you repeat the steps for each exercise (do more repetitions). To prevent the recurrence of back pain, continue to do these exercises once each day or as told by your health care provider. Do exercises exactly as told by your health care provider and adjust them as directed. It is normal to feel mild stretching, pulling, tightness, or discomfort as you do these exercises, but you should stop right away if you feel sudden pain or your pain gets worse. Exercises Single knee to chest Repeat these steps 3-5 times for each leg: Lie on your back on a firm bed or the floor with your legs extended. Bring one knee to your chest. Your other leg should stay extended and in contact with the floor. Hold your knee in place by grabbing your knee or thigh with both hands and hold. Pull on your knee until you feel a gentle stretch in your lower back or buttocks. Hold the stretch for 10-30 seconds. Slowly release and straighten your leg.  Pelvic tilt Repeat these steps 5-10 times: Lie on your back on a firm bed or the floor with your legs extended. Bend your knees so they are pointing toward the ceiling and your feet are flat on the floor. Tighten your lower abdominal muscles to press your lower back against the floor. This motion will tilt your pelvis so your tailbone points up toward the ceiling instead of pointing to your feet or the floor. With gentle tension and even breathing, hold this position for 5-10 seconds.  Cat-cow Repeat these steps until your lower back becomes  more flexible: Get into a hands-and-knees position on a firm bed or the floor. Keep your hands under your shoulders, and keep your knees under your hips. You may place padding under your knees for comfort. Let your head hang down toward your chest. Contract your abdominal muscles and point your tailbone toward the floor so your lower back becomes rounded like the back of a cat. Hold this position for 5 seconds. Slowly lift your head, let your abdominal muscles relax, and point your tailbone up toward the ceiling so your back forms a sagging arch like the back of a cow. Hold this position for 5 seconds.  Press-ups Repeat these steps 5-10 times: Lie on your abdomen (face-down) on a firm bed or the floor. Place your palms near your head, about shoulder-width apart. Keeping your back as relaxed as possible and keeping your hips on the floor, slowly straighten your arms to raise the top half of your body and lift your shoulders. Do not use your back muscles to raise your upper torso. You may adjust the placement of your hands to make yourself more comfortable. Hold this position for 5 seconds while you keep your back relaxed. Slowly return to lying flat on the floor.  Bridges Repeat these steps 10 times: Lie on your back on a firm bed or the floor. Bend your knees so they are pointing toward the ceiling and your feet are flat on the floor. Your arms should be flat   at your sides, next to your body. Tighten your buttocks muscles and lift your buttocks off the floor until your waist is at almost the same height as your knees. You should feel the muscles working in your buttocks and the back of your thighs. If you do not feel these muscles, slide your feet 1-2 inches (2.5-5 cm) farther away from your buttocks. Hold this position for 3-5 seconds. Slowly lower your hips to the starting position, and allow your buttocks muscles to relax completely. If this exercise is too easy, try doing it with your arms  crossed over your chest. Abdominal crunches Repeat these steps 5-10 times: Lie on your back on a firm bed or the floor with your legs extended. Bend your knees so they are pointing toward the ceiling and your feet are flat on the floor. Cross your arms over your chest. Tip your chin slightly toward your chest without bending your neck. Tighten your abdominal muscles and slowly raise your torso high enough to lift your shoulder blades a tiny bit off the floor. Avoid raising your torso higher than that because it can put too much stress on your lower back and does not help to strengthen your abdominal muscles. Slowly return to your starting position.  Back lifts Repeat these steps 5-10 times: Lie on your abdomen (face-down) with your arms at your sides, and rest your forehead on the floor. Tighten the muscles in your legs and your buttocks. Slowly lift your chest off the floor while you keep your hips pressed to the floor. Keep the back of your head in line with the curve in your back. Your eyes should be looking at the floor. Hold this position for 3-5 seconds. Slowly return to your starting position.  Contact a health care provider if: Your back pain or discomfort gets much worse when you do an exercise. Your worsening back pain or discomfort does not lessen within 2 hours after you exercise. If you have any of these problems, stop doing these exercises right away. Do not do them again unless your health care provider says that you can. Get help right away if: You develop sudden, severe back pain. If this happens, stop doing the exercises right away. Do not do them again unless your health care provider says that you can. This information is not intended to replace advice given to you by your health care provider. Make sure you discuss any questions you have with your health care provider. Document Revised: 11/17/2020 Document Reviewed: 08/05/2020 Elsevier Patient Education  2022 [place]. Cervical Strain and Sprain Rehab Ask your health care provider which exercises are safe for you. Do exercises exactly as told by your health care provider and adjust them as directed. It is normal to feel mild stretching, pulling, tightness, or discomfort as you do these exercises. Stop right away if you feel sudden pain or your pain gets worse. Do not begin these exercises until told by your health care provider. Stretching and range-of-motion exercises Cervical side bending  Using good posture, sit on a stable chair or stand up. Without moving your shoulders, slowly tilt your left / right ear to your shoulder until you feel a stretch in the opposite side neck muscles. You should be looking straight ahead. Hold for __________ seconds. Repeat with the other side of your neck. Repeat __________ times. Complete this exercise __________ times a day. Cervical rotation  Using good posture, sit on a stable chair or stand up. Slowly turn your  head to the side as if you are looking over your left / right shoulder. Keep your eyes level with the ground. Stop when you feel a stretch along the side and the back of your neck. Hold for __________ seconds. Repeat this by turning to your other side. Repeat __________ times. Complete this exercise __________ times a day. Thoracic extension and pectoral stretch Roll a towel or a small blanket so it is about 4 inches (10 cm) in diameter. Lie down on your back on a firm surface. Put the towel lengthwise, under your spine in the middle of your back. It should not be under your shoulder blades. The towel should line up with your spine from your middle back to your lower back. Put your hands behind your head and let your elbows fall out to your sides. Hold for __________ seconds. Repeat __________ times. Complete this exercise __________ times a day. Strengthening exercises Isometric upper cervical flexion Lie on your back with a thin pillow behind your  head and a small rolled-up towel under your neck. Gently tuck your chin toward your chest and nod your head down to look toward your feet. Do not lift your head off the pillow. Hold for __________ seconds. Release the tension slowly. Relax your neck muscles completely before you repeat this exercise. Repeat __________ times. Complete this exercise __________ times a day. Isometric cervical extension  Stand about 6 inches (15 cm) away from a wall, with your back facing the wall. Place a soft object, about 6-8 inches (15-20 cm) in diameter, between the back of your head and the wall. A soft object could be a small pillow, a ball, or a folded towel. Gently tilt your head back and press into the soft object. Keep your jaw and forehead relaxed. Hold for __________ seconds. Release the tension slowly. Relax your neck muscles completely before you repeat this exercise. Repeat __________ times. Complete this exercise __________ times a day. Posture and body mechanics Body mechanics refers to the movements and positions of your body while you do your daily activities. Posture is part of body mechanics. Good posture and healthy body mechanics can help to relieve stress in your body's tissues and joints. Good posture means that your spine is in its natural S-curve position (your spine is neutral), your shoulders are pulled back slightly, and your head is not tipped forward. The following are general guidelines for applying improved posture and body mechanics to your everyday activities. Sitting  When sitting, keep your spine neutral and keep your feet flat on the floor. Use a footrest, if necessary, and keep your thighs parallel to the floor. Avoid rounding your shoulders, and avoid tilting your head forward. When working at a desk or a computer, keep your desk at a height where your hands are slightly lower than your elbows. Slide your chair under your desk so you are close enough to maintain good  posture. When working at a computer, place your monitor at a height where you are looking straight ahead and you do not have to tilt your head forward or downward to look at the screen. Standing  When standing, keep your spine neutral and keep your feet about hip-width apart. Keep a slight bend in your knees. Your ears, shoulders, and hips should line up. When you do a task in which you stand in one place for a long time, place one foot up on a stable object that is 2-4 inches (5-10 cm) high, such as a footstool.  This helps keep your spine neutral. Resting When lying down and resting, avoid positions that are most painful for you. Try to support your neck in a neutral position. You can use a contour pillow or a small rolled-up towel. Your pillow should support your neck but not push on it. This information is not intended to replace advice given to you by your health care provider. Make sure you discuss any questions you have with your health care provider. Document Revised: 09/12/2018 Document Reviewed: 02/21/2018 Elsevier Patient Education  2022 [employer].

## 2021-04-20 NOTE — Progress Notes (Signed)
[name] [address] [place] [address] Phone: [phone] Subjective:   Brooke [name], am serving as a scribe for Dr. Antoine Primas. This visit occurred during the SARS-CoV-2 public health emergency.  Safety protocols were in place, including screening questions prior to the visit, additional usage of staff PPE, and extensive cleaning of exam room while observing appropriate contact time as indicated for disinfecting solutions.   I'm seeing this patient by the request  of:  Dorthey Sawyer, MD  CC: Neck pain and hypermobility with multiple joint pain.  [id number]  Brooke [name] is a 25 y.o. female coming in with complaint of amplified musculoskeletal pain syndrome. Has pain throughout entire body in all joints. Had injury in which she was hit in back of head by her lab dog on April 23rd, 2021. Does have neck pain and shoulders. Pain is present all the time. Using medications and stretches.   Did vestibular PT for concussion that occurred when dog hit her in back of head. Has not done any formal PT for hypermobility.   Reviewed other notes and earlier this month patient did see rheumatology for a positive ANA, in addition patient does have some underlying anxiety that seems to be worse recently.  Patient was referred to [place] from rheumatology secondary to the hypermobility arthralgia. Patient's laboratory work-up did show that there was possibility of positive SSB and patient is being followed by rheumatology.     Past Medical History:  Diagnosis Date   Anxiety    Concussion    Depression    Dyschromia    Head ache    Migraine    Past Surgical History:  Procedure Laterality Date   Thumb Surgery, Left     TONSILLECTOMY AND ADENOIDECTOMY     UPPER GI ENDOSCOPY  02/25/2021   Social History   Socioeconomic History   Marital status: Single    Spouse name: Not on file   Number of children: Not on file   Years of education: Not  on file   Highest education level: Not on file  Occupational History   Not on file  Tobacco Use   Smoking status: Former   Smokeless tobacco: Never  Vaping Use   Vaping Use: Every day  Substance and Sexual Activity   Alcohol use: Yes    Comment: occ   Drug use: Yes    Types: Marijuana   Sexual activity: Yes  Other Topics Concern   Not on file  Social History Narrative   Pt states she is now having sexual intercourse with men    Social Determinants of Health   Financial Resource Strain: Not on file  Food Insecurity: Not on file  Transportation Needs: Not on file  Physical Activity: Not on file  Stress: Not on file  Social Connections: Not on file   Allergies  Allergen Reactions   Sulfa Antibiotics    Wellbutrin [Bupropion] Anxiety   Family History  Problem Relation Age of Onset   Chronic fatigue Mother    Fibromyalgia Mother    Thyroid disease Mother    Congenital heart disease Mother    Prostate cancer Father    Heart murmur Brother    Autism Brother    Asthma Brother    Autism Brother    Asperger's syndrome Brother    Hypertension Maternal Grandmother    Hyperlipidemia Maternal Grandmother    Cancer Maternal Grandfather       Current Outpatient Medications (Respiratory):  promethazine (PHENERGAN) 25 MG suppository, Place 1 suppository (25 mg total) rectally every 6 (six) hours as needed for nausea or vomiting.  Current Outpatient Medications (Analgesics):    acetaminophen (TYLENOL) 325 MG tablet, Take by mouth as needed.   Erenumab-aooe (AIMOVIG) 140 MG/ML SOAJ,    naproxen (NAPROSYN) 500 MG tablet, Take 1 tablet (500 mg total) by mouth 2 (two) times daily with a meal. (Patient taking differently: Take 500 mg by mouth as needed.)   rizatriptan (MAXALT) 10 MG tablet, Take 1 tab at first sign of migraine. May repeat in 2 hours if needed   Current Outpatient Medications (Other):    cyclobenzaprine (FLEXERIL) 5 MG tablet, Take 5 mg by mouth at bedtime as  needed.   gabapentin (NEURONTIN) 300 MG capsule, Take 1 capsule (300 mg total) by mouth 3 (three) times daily as needed.   nortriptyline (PAMELOR) 50 MG capsule,    ondansetron (ZOFRAN) 4 MG tablet,    prochlorperazine (COMPAZINE) 5 MG tablet, Take by mouth.   topiramate (TOPAMAX) 100 MG tablet, TAKE 1 TABLET(100 MG) BY MOUTH TWICE DAILY (Patient taking differently: daily.)   traZODone (DESYREL) 50 MG tablet, Take 50 mg by mouth at bedtime.   valACYclovir (VALTREX) 1000 MG tablet, Take 1 tablet (1,000 mg total) by mouth 2 (two) times daily. (Patient taking differently: Take 1,000 mg by mouth as needed.)   Vitamin D, Ergocalciferol, (DRISDOL) 1.25 MG (50000 UNIT) CAPS capsule, Take 1 capsule (50,000 Units total) by mouth every 7 (seven) days.    Review of Systems:  No headache, visual changes, nausea, vomiting, diarrhea, constipation, dizziness, abdominal pain, skin rash, fevers, chills, night sweats, weight loss, swollen lymph nodes, body aches, joint swelling, chest pain, shortness of breath, mood changes. POSITIVE muscle aches, body aches  Objective  Blood pressure 108/72, pulse 73, height 5' 1.5" (1.562 m), weight 151 lb (68.5 kg), SpO2 99 %.   General: No apparent distress alert and oriented x3 mood and affect normal, dressed appropriately.  HEENT: Pupils equal, extraocular movements intact  Respiratory: Patient's speak in full sentences and does not appear short of breath  Cardiovascular: No lower extremity edema, non tender, no erythema  Gait normal with good balance and coordination.  MSK:   Significant hypermobility noted.  Patient does have a Beighton score of 8-9 Neck exam does show mild positive Spurling's on the left side with mild radicular symptoms more in the C5-C6 distribution.  No significant weakness still noted patient has hypermobility of the shoulders bilaterally but does have limited range of motion of the neck especially with sidebending.  Lacks the last 10 degrees of  extension as well.  Patient does have pain that is out of proportion to the amount of palpation in multiple different soft tissues throughout the paraspinal musculature.   Impression and Recommendations:     The above documentation has been reviewed and is accurate and complete Judi Saa, DO

## 2021-04-21 ENCOUNTER — Encounter: Payer: Self-pay | Admitting: Family Medicine

## 2021-04-21 ENCOUNTER — Other Ambulatory Visit: Payer: Self-pay

## 2021-04-21 ENCOUNTER — Ambulatory Visit (INDEPENDENT_AMBULATORY_CARE_PROVIDER_SITE_OTHER): Admitting: Family Medicine

## 2021-04-21 VITALS — BP 108/72 | HR 73 | Ht 61.5 in | Wt 151.0 lb

## 2021-04-21 DIAGNOSIS — M542 Cervicalgia: Secondary | ICD-10-CM | POA: Diagnosis not present

## 2021-04-21 DIAGNOSIS — M2550 Pain in unspecified joint: Secondary | ICD-10-CM | POA: Diagnosis not present

## 2021-04-21 DIAGNOSIS — M5010 Cervical disc disorder with radiculopathy, unspecified cervical region: Secondary | ICD-10-CM | POA: Diagnosis not present

## 2021-04-21 NOTE — Patient Instructions (Signed)
Recommend seeing eye doctor and cardiology Read about Effexor  Epidural [phone] See me again 6 weeks after injection

## 2021-04-21 NOTE — Assessment & Plan Note (Signed)
Patient does have hypermobility noted.  Does have a Beighton score of 8to 9.  This is fairly high.  I do recommend that patient does get a echocardiogram as well as an ophthalmology exam.  Differential does include Erler's Danlos but I think it is likely going to be more of a benign hypermobility.  Patient is being worked up for the possibility of Sjogren's syndrome secondary to lab work and will be followed by rheumatology still.  Discussed with patient I do think that something such as Effexor could be beneficial as well with patient's underlying anxiety and depression.  Patient was also taking different medications that could potentially interact and would like her to discuss with primary care provider as well as potentially changing the trazodone and the nortriptyline.  Patient states that she is already discontinued the Remeron and the Seroquel.  Patient will get back to me if we do not make this change before see her otherwise we will follow-up with me again 6 to 8 weeks.

## 2021-04-21 NOTE — Assessment & Plan Note (Signed)
Patient's MRI was independently visualized by me and patient's MRI did show a potential left C6 nerve impingement.  This will be consistent with the upper extremity findings noted.  Patient did have some difficulty with the lower extremity and initially as well.  Do not find any findings of the lower extremity at the moment.  Patient is in significant amount of pain and would like to try potential epidural at the C7-T1 area to see if this will help with some of the pain.  This will be diagnostic as well as hopefully therapeutic with patient.  Patient has been doing formal physical therapy and we discussed that patient needs more for stability during range of motion at this point.  Patient will follow up with me again in 4 to 6 weeks after the injection to see how patient is responding.

## 2021-05-04 ENCOUNTER — Inpatient Hospital Stay
Admission: RE | Admit: 2021-05-04 | Discharge: 2021-05-04 | Disposition: A | Discharge status: Home / Self Care | Source: Ambulatory Visit | Attending: Family Medicine | Admitting: Family Medicine

## 2021-05-04 NOTE — Discharge Instructions (Signed)

## 2021-07-01 ENCOUNTER — Other Ambulatory Visit: Payer: Self-pay | Admitting: Gastroenterology

## 2021-07-01 DIAGNOSIS — R112 Nausea with vomiting, unspecified: Secondary | ICD-10-CM

## 2021-07-02 NOTE — Progress Notes (Deleted)
Office Visit Note  Patient: Brooke [name]             Date of Birth: 11/10/1995           MRN: [id number]             PCP: Reyes Ivan, MD Referring: Reyes Ivan,* Visit Date: 07/14/2021 Occupation: _0 @  Subjective:  No chief complaint on file.   History of Present Illness: Brooke [name] is a 26 y.o. female ***   Activities of Daily Living:  Patient reports morning stiffness for *** {minute/hour:19697}.   Patient {ACTIONS;DENIES/REPORTS:21021675::"Denies"} nocturnal pain.  Difficulty dressing/grooming: {ACTIONS;DENIES/REPORTS:21021675::"Denies"} Difficulty climbing stairs: {ACTIONS;DENIES/REPORTS:21021675::"Denies"} Difficulty getting out of chair: {ACTIONS;DENIES/REPORTS:21021675::"Denies"} Difficulty using hands for taps, buttons, cutlery, and/or writing: {ACTIONS;DENIES/REPORTS:21021675::"Denies"}  No Rheumatology ROS completed.   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    Past Medical History:  Diagnosis Date   Anxiety    Concussion    Depression    Dyschromia    Head ache    Migraine     Family History  Problem Relation Age of Onset   Chronic fatigue Mother    Fibromyalgia Mother    Thyroid disease Mother    Congenital heart disease Mother    Prostate cancer Father    Heart murmur Brother    Autism Brother    Asthma Brother    Autism Brother    Asperger's syndrome Brother    Hypertension Maternal Grandmother    Hyperlipidemia Maternal Grandmother    Cancer Maternal  Grandfather    Past Surgical History:  Procedure Laterality Date   Thumb Surgery, Left     TONSILLECTOMY AND ADENOIDECTOMY     UPPER GI ENDOSCOPY  02/25/2021   Social History   Social History Narrative   Pt states she is now having sexual intercourse with men    Immunization History  Administered Date(s) Administered   DTaP 07/03/1998, 09/05/1998, 11/09/1998, 10/28/1999, 08/09/2002   HPV 9-valent 07/14/2014   HPV Quadrivalent 12/28/2010, 03/16/2011   Hepatitis A, Adult 10/04/2001, 08/09/2002   Hepatitis B, adult 07/03/1998, 09/18/1998, 05/06/1999   HiB (PRP-OMP) 07/03/1998, 09/18/1998, 05/06/1999   IPV 07/03/1998, 11/09/1998, 08/09/2002   Influenza,inj,Quad PF,6+ Mos 03/20/2018, 03/11/2019   MMR 05/06/1999, 08/09/2002   Meningococcal B, OMV 09/27/2017   Meningococcal Conjugate 10/27/2009, 09/08/2016   Tdap 06/21/2009, 09/27/2017   Varicella 03/02/2000, 11/15/2006     Objective: Vital Signs: There were no vitals taken for this visit.   Physical Exam   Musculoskeletal Exam: ***  CDAI Exam: CDAI Score: -- Patient Global: --; Provider Global: -- Swollen: --; Tender: -- Joint Exam 07/14/2021   No joint exam has been documented for this visit   There is currently no information documented on the homunculus. Go to the Rheumatology activity and complete the homunculus joint exam.  Investigation: No additional findings.  Imaging: No results found.  Recent Labs: Lab Results  Component Value Date   WBC 7.1 01/15/2021   HGB 14.8 01/15/2021   PLT 358 01/15/2021   NA 140 01/15/2021   K 3.7 01/15/2021   CL 104 01/15/2021  CO2 23 01/15/2021   GLUCOSE 91 01/15/2021   BUN 9 01/15/2021   CREATININE 0.96 01/15/2021   BILITOT 0.2 01/15/2021   ALKPHOS 85 01/15/2021   AST 16 01/15/2021   ALT 10 01/15/2021   PROT 7.3 01/15/2021   ALBUMIN 4.7 01/15/2021   CALCIUM 9.7 01/15/2021    Speciality Comments: No specialty comments available.  Procedures:  No procedures  performed Allergies: Sulfa antibiotics and Wellbutrin [bupropion]   Assessment / Plan:     Visit Diagnoses: No diagnosis found.  Orders: No orders of the defined types were placed in this encounter.  No orders of the defined types were placed in this encounter.   Face-to-face time spent with patient was *** minutes. Greater than 50% of time was spent in counseling and coordination of care.  Follow-Up Instructions: No follow-ups on file.   Earnestine Mealing, CMA  Note - This record has been created using Editor, commissioning.  Chart creation errors have been sought, but may not always  have been located. Such creation errors do not reflect on  the standard of medical care.

## 2021-07-14 ENCOUNTER — Ambulatory Visit: Admitting: Rheumatology

## 2021-07-14 DIAGNOSIS — Z8269 Family history of other diseases of the musculoskeletal system and connective tissue: Secondary | ICD-10-CM

## 2021-07-14 DIAGNOSIS — M5030 Other cervical disc degeneration, unspecified cervical region: Secondary | ICD-10-CM

## 2021-07-14 DIAGNOSIS — M545 Low back pain, unspecified: Secondary | ICD-10-CM

## 2021-07-14 DIAGNOSIS — F331 Major depressive disorder, recurrent, moderate: Secondary | ICD-10-CM

## 2021-07-14 DIAGNOSIS — Z8669 Personal history of other diseases of the nervous system and sense organs: Secondary | ICD-10-CM

## 2021-07-14 DIAGNOSIS — E559 Vitamin D deficiency, unspecified: Secondary | ICD-10-CM

## 2021-07-14 DIAGNOSIS — M2141 Flat foot [pes planus] (acquired), right foot: Secondary | ICD-10-CM

## 2021-07-14 DIAGNOSIS — G4709 Other insomnia: Secondary | ICD-10-CM

## 2021-07-14 DIAGNOSIS — M3500 Sicca syndrome, unspecified: Secondary | ICD-10-CM

## 2021-07-14 DIAGNOSIS — M7918 Myalgia, other site: Secondary | ICD-10-CM

## 2021-07-14 DIAGNOSIS — M2550 Pain in unspecified joint: Secondary | ICD-10-CM

## 2021-07-14 DIAGNOSIS — R768 Other specified abnormal immunological findings in serum: Secondary | ICD-10-CM

## 2021-07-14 DIAGNOSIS — F900 Attention-deficit hyperactivity disorder, predominantly inattentive type: Secondary | ICD-10-CM

## 2021-07-21 DIAGNOSIS — R87619 Unspecified abnormal cytological findings in specimens from cervix uteri: Secondary | ICD-10-CM | POA: Insufficient documentation

## 2021-07-21 DIAGNOSIS — N72 Inflammatory disease of cervix uteri: Secondary | ICD-10-CM | POA: Diagnosis not present

## 2021-08-27 ENCOUNTER — Other Ambulatory Visit: Payer: Self-pay | Admitting: Nurse Practitioner

## 2021-08-30 NOTE — Telephone Encounter (Signed)
Requested Prescriptions  ?Pending Prescriptions Disp Refills  ?? topiramate (TOPAMAX) 100 MG tablet [Pharmacy Med Name: TOPIRAMATE 100MG  TABLETS] 180 tablet 0  ?  Sig: TAKE 1 TABLET(100 MG) BY MOUTH TWICE DAILY  ?  ? Neurology: Anticonvulsants - topiramate & zonisamide Passed - 08/27/2021  7:06 AM  ?  ?  Passed - Cr in normal range and within 360 days  ?  Creatinine, Ser  ?Date Value Ref Range Status  ?01/15/2021 0.96 0.57 - 1.00 mg/dL Final  ?   ?  ?  Passed - CO2 in normal range and within 360 days  ?  CO2  ?Date Value Ref Range Status  ?01/15/2021 23 20 - 29 mmol/L Final  ?   ?  ?  Passed - ALT in normal range and within 360 days  ?  ALT  ?Date Value Ref Range Status  ?01/15/2021 10 0 - 32 IU/L Final  ?   ?  ?  Passed - AST in normal range and within 360 days  ?  AST  ?Date Value Ref Range Status  ?01/15/2021 16 0 - 40 IU/L Final  ?   ?  ?  Passed - Completed PHQ-2 or PHQ-9 in the last 360 days  ?  ?  Passed - Valid encounter within last 12 months  ?  Recent Outpatient Visits   ?      ? 7 months ago Numbness and tingling  ? [facility], NP  ? 10 months ago Abnormal neurological exam  ? [facility], NP  ? 10 months ago Concussion without loss of consciousness, subsequent encounter  ? [facility], NP  ? 1 year ago Migraine without status migrainosus, not intractable, unspecified migraine type  ? [facility] M, DO  ? 1 year ago COVID-19  ? [facility], DO  ?  ?  ? ?  ?  ?  ? ?

## 2022-02-22 DIAGNOSIS — R1030 Lower abdominal pain, unspecified: Secondary | ICD-10-CM | POA: Insufficient documentation

## 2022-04-14 ENCOUNTER — Other Ambulatory Visit: Payer: Self-pay | Admitting: Gastroenterology

## 2022-04-14 DIAGNOSIS — R112 Nausea with vomiting, unspecified: Secondary | ICD-10-CM

## 2022-04-20 ENCOUNTER — Encounter

## 2022-04-29 ENCOUNTER — Ambulatory Visit

## 2022-05-24 DIAGNOSIS — F1090 Alcohol use, unspecified, uncomplicated: Secondary | ICD-10-CM | POA: Insufficient documentation

## 2022-05-24 DIAGNOSIS — S80869A Insect bite (nonvenomous), unspecified lower leg, initial encounter: Secondary | ICD-10-CM | POA: Insufficient documentation

## 2022-05-27 ENCOUNTER — Other Ambulatory Visit

## 2022-05-27 ENCOUNTER — Ambulatory Visit
Admission: RE | Admit: 2022-05-27 | Discharge: 2022-05-27 | Disposition: A | Discharge status: Home / Self Care | Source: Ambulatory Visit | Attending: Gastroenterology | Admitting: Gastroenterology

## 2022-05-27 DIAGNOSIS — R112 Nausea with vomiting, unspecified: Secondary | ICD-10-CM | POA: Diagnosis present

## 2022-05-27 MED ORDER — TECHNETIUM TC 99M SULFUR COLLOID
1.8200 | Freq: Once | INTRAVENOUS | Status: AC | PRN
  Administered 2022-05-27: 1.82 via ORAL

## 2022-06-28 ENCOUNTER — Encounter: Payer: Self-pay | Admitting: Emergency Medicine

## 2022-06-28 ENCOUNTER — Emergency Department
Admission: EM | Admit: 2022-06-28 | Discharge: 2022-06-28 | Disposition: A | Discharge status: Home / Self Care | Attending: Emergency Medicine | Admitting: Emergency Medicine

## 2022-06-28 ENCOUNTER — Other Ambulatory Visit: Payer: Self-pay

## 2022-06-28 ENCOUNTER — Emergency Department

## 2022-06-28 DIAGNOSIS — S93492A Sprain of other ligament of left ankle, initial encounter: Secondary | ICD-10-CM | POA: Diagnosis not present

## 2022-06-28 DIAGNOSIS — S99912A Unspecified injury of left ankle, initial encounter: Secondary | ICD-10-CM | POA: Diagnosis present

## 2022-06-28 DIAGNOSIS — M25562 Pain in left knee: Secondary | ICD-10-CM | POA: Diagnosis not present

## 2022-06-28 DIAGNOSIS — S93432A Sprain of tibiofibular ligament of left ankle, initial encounter: Secondary | ICD-10-CM | POA: Diagnosis not present

## 2022-06-28 DIAGNOSIS — W108XXA Fall (on) (from) other stairs and steps, initial encounter: Secondary | ICD-10-CM | POA: Diagnosis not present

## 2022-06-28 MED ORDER — HYDROCODONE-ACETAMINOPHEN 5-325 MG PO TABS: 1.0000 | ORAL_TABLET | Freq: Four times a day (QID) | ORAL | 0 refills | Status: AC | PRN

## 2022-06-28 MED ORDER — NAPROXEN 500 MG PO TABS: 500.0000 mg | ORAL_TABLET | Freq: Two times a day (BID) | ORAL | 0 refills | Status: AC

## 2022-06-28 NOTE — ED Triage Notes (Signed)
Pt via POV from home. Pt c/o fall 2 days ago down 4 steps, pt c/o L ankle pain and knee pain. States she is having ankle swelling. Pt is A&Ox4 and NAD

## 2022-06-28 NOTE — ED Provider Notes (Signed)
[facility] Provider Note    Event Date/Time   First MD Initiated Contact with Patient 06/28/22 1408     (approximate)   History   Fall and Ankle Pain   HPI  Brooke [name] is a 27 y.o. female with history of ADHD, migraine and as listed in EMR presents to the emergency department for treatment and evaluation of left ankle pain and knee pain x 2 days ago after falling down 4 steps.  Is having pain and swelling to the left ankle and pain to the left knee.  No other injury.  Pain has not improved with over the counter medications, rest, and elevation.   Physical Exam   Triage Vital Signs: ED Triage Vitals  Enc Vitals Group     BP 06/28/22 1315 122/85     Pulse Rate 06/28/22 1315 (!) 107     Resp 06/28/22 1315 16     Temp 06/28/22 1315 98.5 F (36.9 C)     Temp Source 06/28/22 1315 Oral     SpO2 06/28/22 1315 100 %     Weight 06/28/22 1310 160 lb (72.6 kg)     Height 06/28/22 1310 5\' 1"  (1.549 m)     Head Circumference --      Peak Flow --      Pain Score 06/28/22 1310 8     Pain Loc --      Pain Edu? --      Excl. in [place]? --     Most recent vital signs: Vitals:   06/28/22 1315 06/28/22 1503  BP: 122/85 122/69  Pulse: (!) 107 99  Resp: 16 18  Temp: 98.5 F (36.9 C) 98 F (36.7 C)  SpO2: 100% 99%    General: Awake, no distress.  CV:  Good peripheral perfusion.  Resp:  Normal effort.  Abd:  No distention.  Other:  Swelling over the lateral aspect of the left ankle.  [place] ankle rules are negative.   ED Results / Procedures / Treatments   Labs (all labs ordered are listed, but only abnormal results are displayed) Labs Reviewed - No data to display   EKG  Not indicated   RADIOLOGY  Image and radiology report reviewed and interpreted by me. Radiology report consistent with the same.  Imaging of the left knee and left ankle negative for acute bony abnormality.  PROCEDURES:  Critical Care performed:  No  Procedures   MEDICATIONS ORDERED IN ED:  Medications - No data to display   IMPRESSION / MDM / [place] / ED COURSE   I have reviewed the triage note.  Differential diagnosis includes, but is not limited to, ankle sprain, knee sprain, contusion, fracture  Patient's presentation is most consistent with acute illness / injury with system symptoms.  27 year old female presenting to the emergency department for treatment and evaluation of left ankle and left knee pain after falling down 4 steps 2 days ago.  See HPI for further details.  Imaging and exam are consistent.  Left ankle sprain with swelling to the lateral aspect over the lateral malleolus.  She will be placed in an ASO and given crutches.  She was encouraged to continue her over-the-counter medications and is to use Norco sparingly and only for severe pain if OTC medications aren't working.      FINAL CLINICAL IMPRESSION(S) / ED DIAGNOSES   Final diagnoses:  Sprain of anterior talofibular ligament of left ankle, initial encounter  Rx / DC Orders   ED Discharge Orders          Ordered    naproxen (NAPROSYN) 500 MG tablet  2 times daily with meals        06/28/22 1450    HYDROcodone-acetaminophen (NORCO/VICODIN) 5-325 MG tablet  Every 6 hours PRN        06/28/22 1450             Note:  This document was prepared using Dragon voice recognition software and may include unintentional dictation errors.   Victorino Dike, FNP 06/28/22 1704    Harvest Dark, MD 07/01/22 1545

## 2022-07-11 DIAGNOSIS — S93402A Sprain of unspecified ligament of left ankle, initial encounter: Secondary | ICD-10-CM | POA: Diagnosis not present

## 2022-07-11 DIAGNOSIS — M25572 Pain in left ankle and joints of left foot: Secondary | ICD-10-CM | POA: Diagnosis not present

## 2022-07-28 DIAGNOSIS — R110 Nausea: Secondary | ICD-10-CM | POA: Diagnosis not present

## 2022-07-28 DIAGNOSIS — K5901 Slow transit constipation: Secondary | ICD-10-CM | POA: Diagnosis not present

## 2022-07-28 DIAGNOSIS — E0590 Thyrotoxicosis, unspecified without thyrotoxic crisis or storm: Secondary | ICD-10-CM | POA: Diagnosis not present

## 2022-07-28 DIAGNOSIS — U099 Post covid-19 condition, unspecified: Secondary | ICD-10-CM | POA: Diagnosis not present

## 2022-07-28 DIAGNOSIS — K3184 Gastroparesis: Secondary | ICD-10-CM | POA: Diagnosis not present

## 2022-07-28 DIAGNOSIS — G9332 Myalgic encephalomyelitis/chronic fatigue syndrome: Secondary | ICD-10-CM | POA: Diagnosis not present

## 2022-08-10 DIAGNOSIS — S93402A Sprain of unspecified ligament of left ankle, initial encounter: Secondary | ICD-10-CM | POA: Diagnosis not present

## 2022-08-10 DIAGNOSIS — M25572 Pain in left ankle and joints of left foot: Secondary | ICD-10-CM | POA: Diagnosis not present

## 2022-12-21 DIAGNOSIS — F411 Generalized anxiety disorder: Secondary | ICD-10-CM | POA: Insufficient documentation

## 2023-02-07 DIAGNOSIS — R058 Other specified cough: Secondary | ICD-10-CM | POA: Insufficient documentation

## 2023-05-15 ENCOUNTER — Ambulatory Visit

## 2023-07-07 DIAGNOSIS — M542 Cervicalgia: Secondary | ICD-10-CM | POA: Diagnosis not present

## 2023-07-07 DIAGNOSIS — M5442 Lumbago with sciatica, left side: Secondary | ICD-10-CM | POA: Diagnosis not present

## 2023-07-07 DIAGNOSIS — M21619 Bunion of unspecified foot: Secondary | ICD-10-CM | POA: Diagnosis not present

## 2023-07-07 DIAGNOSIS — M62838 Other muscle spasm: Secondary | ICD-10-CM | POA: Diagnosis not present

## 2023-07-07 DIAGNOSIS — M25552 Pain in left hip: Secondary | ICD-10-CM | POA: Diagnosis not present

## 2023-07-28 DIAGNOSIS — R051 Acute cough: Secondary | ICD-10-CM | POA: Diagnosis not present

## 2023-10-24 ENCOUNTER — Encounter: Payer: Self-pay | Admitting: Emergency Medicine

## 2023-10-24 ENCOUNTER — Emergency Department
Admission: EM | Admit: 2023-10-24 | Discharge: 2023-10-25 | Disposition: A | Discharge status: Home / Self Care | Attending: Emergency Medicine | Admitting: Emergency Medicine

## 2023-10-24 ENCOUNTER — Emergency Department

## 2023-10-24 ENCOUNTER — Other Ambulatory Visit: Payer: Self-pay

## 2023-10-24 ENCOUNTER — Ambulatory Visit (INDEPENDENT_AMBULATORY_CARE_PROVIDER_SITE_OTHER)
Admission: EM | Admit: 2023-10-24 | Discharge: 2023-10-24 | Disposition: A | Discharge status: Home / Self Care | Source: Home / Self Care

## 2023-10-24 ENCOUNTER — Ambulatory Visit (INDEPENDENT_AMBULATORY_CARE_PROVIDER_SITE_OTHER)

## 2023-10-24 DIAGNOSIS — R748 Abnormal levels of other serum enzymes: Secondary | ICD-10-CM | POA: Diagnosis not present

## 2023-10-24 DIAGNOSIS — M5441 Lumbago with sciatica, right side: Secondary | ICD-10-CM | POA: Diagnosis not present

## 2023-10-24 DIAGNOSIS — R509 Fever, unspecified: Secondary | ICD-10-CM

## 2023-10-24 DIAGNOSIS — R634 Abnormal weight loss: Secondary | ICD-10-CM

## 2023-10-24 DIAGNOSIS — M5442 Lumbago with sciatica, left side: Secondary | ICD-10-CM | POA: Insufficient documentation

## 2023-10-24 DIAGNOSIS — E860 Dehydration: Secondary | ICD-10-CM

## 2023-10-24 DIAGNOSIS — R112 Nausea with vomiting, unspecified: Secondary | ICD-10-CM | POA: Diagnosis not present

## 2023-10-24 DIAGNOSIS — M545 Low back pain, unspecified: Secondary | ICD-10-CM

## 2023-10-24 DIAGNOSIS — F4325 Adjustment disorder with mixed disturbance of emotions and conduct: Secondary | ICD-10-CM | POA: Insufficient documentation

## 2023-10-24 DIAGNOSIS — R638 Other symptoms and signs concerning food and fluid intake: Secondary | ICD-10-CM | POA: Diagnosis not present

## 2023-10-24 DIAGNOSIS — R5383 Other fatigue: Secondary | ICD-10-CM | POA: Diagnosis not present

## 2023-10-24 LAB — COMPREHENSIVE METABOLIC PANEL WITH GFR
ALT: 146 U/L — ABNORMAL HIGH (ref 0–44)
ALT: 158 U/L — ABNORMAL HIGH (ref 0–44)
AST: 156 U/L — ABNORMAL HIGH (ref 15–41)
AST: 175 U/L — ABNORMAL HIGH (ref 15–41)
Albumin: 3.9 g/dL (ref 3.5–5.0)
Albumin: 3.9 g/dL (ref 3.5–5.0)
Alkaline Phosphatase: 101 U/L (ref 38–126)
Alkaline Phosphatase: 86 U/L (ref 38–126)
Anion gap: 9 (ref 5–15)
Anion gap: 11 (ref 5–15)
BUN: 7 mg/dL (ref 6–20)
BUN: 6 mg/dL (ref 6–20)
CO2: 22 mmol/L (ref 22–32)
CO2: 22 mmol/L (ref 22–32)
Calcium: 8.9 mg/dL (ref 8.9–10.3)
Calcium: 9.1 mg/dL (ref 8.9–10.3)
Chloride: 101 mmol/L (ref 98–111)
Chloride: 100 mmol/L (ref 98–111)
Creatinine, Ser: 0.97 mg/dL (ref 0.44–1.00)
Creatinine, Ser: 0.85 mg/dL (ref 0.44–1.00)
GFR, Estimated: >60 mL/min (ref >60)
GFR, Estimated: >60 mL/min (ref >60)
Glucose, Bld: 125 mg/dL — ABNORMAL HIGH (ref 70–99)
Glucose, Bld: 134 mg/dL — ABNORMAL HIGH (ref 70–99)
Potassium: 3.7 mmol/L (ref 3.5–5.1)
Potassium: 3.5 mmol/L (ref 3.5–5.1)
Sodium: 132 mmol/L — ABNORMAL LOW (ref 135–145)
Sodium: 133 mmol/L — ABNORMAL LOW (ref 135–145)
Total Bilirubin: 1.1 mg/dL (ref 0.0–1.2)
Total Bilirubin: 1.4 mg/dL — ABNORMAL HIGH (ref 0.0–1.2)
Total Protein: 8.2 g/dL — ABNORMAL HIGH (ref 6.5–8.1)
Total Protein: 7.9 g/dL (ref 6.5–8.1)

## 2023-10-24 LAB — URINALYSIS, W/ REFLEX TO CULTURE (INFECTION SUSPECTED)
Bilirubin Urine: NEGATIVE
Glucose, UA: NEGATIVE mg/dL
Ketones, ur: NEGATIVE mg/dL
Leukocytes,Ua: NEGATIVE
Nitrite: NEGATIVE
Protein, ur: NEGATIVE mg/dL
Specific Gravity, Urine: 1.01 (ref 1.005–1.030)
pH: 6 (ref 5.0–8.0)

## 2023-10-24 LAB — CBC
HCT: 36.3 % (ref 36.0–46.0)
Hemoglobin: 12.7 g/dL (ref 12.0–15.0)
MCH: 29.3 pg (ref 26.0–34.0)
MCHC: 35 g/dL (ref 30.0–36.0)
MCV: 83.6 fL (ref 80.0–100.0)
Platelets: 188 10*3/uL (ref 150–400)
RBC: 4.34 MIL/uL (ref 3.87–5.11)
RDW: 13.9 % (ref 11.5–15.5)
WBC: 6.5 10*3/uL (ref 4.0–10.5)
nRBC: 0 % (ref 0.0–0.2)

## 2023-10-24 LAB — CBC WITH DIFFERENTIAL/PLATELET
Abs Immature Granulocytes: 0.06 10*3/uL (ref 0.00–0.07)
Basophils Absolute: 0 10*3/uL (ref 0.0–0.1)
Basophils Relative: 1 %
Eosinophils Absolute: 0.1 10*3/uL (ref 0.0–0.5)
Eosinophils Relative: 2 %
HCT: 36.4 % (ref 36.0–46.0)
Hemoglobin: 12.6 g/dL (ref 12.0–15.0)
Immature Granulocytes: 1 %
Lymphocytes Relative: 39 %
Lymphs Abs: 2.5 10*3/uL (ref 0.7–4.0)
MCH: 28.4 pg (ref 26.0–34.0)
MCHC: 34.6 g/dL (ref 30.0–36.0)
MCV: 82.2 fL (ref 80.0–100.0)
Monocytes Absolute: 0.2 10*3/uL (ref 0.1–1.0)
Monocytes Relative: 3 %
Neutro Abs: 3.6 10*3/uL (ref 1.7–7.7)
Neutrophils Relative %: 54 %
Platelets: 176 10*3/uL (ref 150–400)
RBC: 4.43 MIL/uL (ref 3.87–5.11)
RDW: 14.2 % (ref 11.5–15.5)
WBC: 6.5 10*3/uL (ref 4.0–10.5)
nRBC: 0 % (ref 0.0–0.2)

## 2023-10-24 LAB — POC URINE PREG, ED: Preg Test, Ur: NEGATIVE

## 2023-10-24 LAB — HIV ANTIBODY (ROUTINE TESTING W REFLEX): HIV Screen 4th Generation wRfx: NONREACTIVE

## 2023-10-24 LAB — GROUP A STREP BY PCR: Group A Strep by PCR: NOT DETECTED

## 2023-10-24 LAB — LACTIC ACID, PLASMA
Lactic Acid, Venous: 1.8 mmol/L (ref 0.5–1.9)
Lactic Acid, Venous: 1.7 mmol/L (ref 0.5–1.9)

## 2023-10-24 LAB — PROTIME-INR
INR: 1.1 (ref 0.8–1.2)
Prothrombin Time: 14.1 s (ref 11.4–15.2)

## 2023-10-24 LAB — PREGNANCY, URINE: Preg Test, Ur: NEGATIVE

## 2023-10-24 LAB — LIPASE, BLOOD: Lipase: 26 U/L (ref 11–51)

## 2023-10-24 LAB — CK: Total CK: 38 U/L (ref 38–234)

## 2023-10-24 LAB — MONONUCLEOSIS SCREEN: Mono Screen: NEGATIVE

## 2023-10-24 MED ORDER — IOHEXOL 300 MG/ML  SOLN
100.0000 mL | Freq: Once | INTRAMUSCULAR | Status: AC | PRN
  Administered 2023-10-24: 100 mL via INTRAVENOUS

## 2023-10-24 MED ORDER — ACETAMINOPHEN 500 MG PO TABS
1000.0000 mg | ORAL_TABLET | Freq: Once | ORAL | Status: DC
  Filled 2023-10-24: qty 2

## 2023-10-24 MED ORDER — KETOROLAC TROMETHAMINE 30 MG/ML IJ SOLN
30.0000 mg | Freq: Once | INTRAMUSCULAR | Status: AC
  Administered 2023-10-24: 30 mg via INTRAVENOUS
  Filled 2023-10-24: qty 1

## 2023-10-24 MED ORDER — DROPERIDOL 2.5 MG/ML IJ SOLN
2.5000 mg | Freq: Once | INTRAMUSCULAR | Status: AC
  Administered 2023-10-24: 2.5 mg via INTRAVENOUS
  Filled 2023-10-24: qty 2

## 2023-10-24 MED ORDER — ONDANSETRON 8 MG PO TBDP: 8.0000 mg | ORAL_TABLET | Freq: Three times a day (TID) | ORAL | 0 refills | Status: AC | PRN

## 2023-10-24 MED ORDER — OXYCODONE-ACETAMINOPHEN 5-325 MG PO TABS: 1.0000 | ORAL_TABLET | ORAL | 0 refills | Status: AC | PRN

## 2023-10-24 MED ORDER — ONDANSETRON HCL 4 MG/2ML IJ SOLN
4.0000 mg | Freq: Once | INTRAMUSCULAR | Status: AC
  Administered 2023-10-24: 4 mg via INTRAVENOUS
  Filled 2023-10-24: qty 2

## 2023-10-24 MED ORDER — LACTATED RINGERS IV BOLUS
1000.0000 mL | Freq: Once | INTRAVENOUS | Status: AC
Start: 2023-10-24 — End: 2023-10-25
  Administered 2023-10-24: 1000 mL via INTRAVENOUS

## 2023-10-24 NOTE — ED Provider Notes (Signed)
 [facility] Provider Note   Event Date/Time   First MD Initiated Contact with Patient 10/24/23 1805     (approximate) History  Back Pain, sent for CT, and sent for liver labs  HPI Brooke [name] is a 28 y.o. 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  Patient complains of low back pain over the last 2 weeks that radiates into the legs and sometimes causes her to feel numb when she sits.  Patient also noted swollen lymph nodes in her neck at that time that has now progressed to congestion and denies sore throat.  Patient states that she has had fevers every day over the last week with the highest being 101.  Patient also endorses dark urine over the past 4 days given she has been unable to eat or drink secondary to nausea.  Patient also endorses intermittent left upper quadrant pain that is worse when she vomits. ROS: Patient currently denies any vision changes, tinnitus, difficulty speaking, facial droop, sore throat, chest pain, shortness of breath, diarrhea, dysuria, or weakness/numbness/paresthesias in any extremity   Physical Exam  Triage Vital Signs: ED Triage Vitals  Encounter Vitals Group     BP 10/24/23 1724 122/81     Systolic BP Percentile --      Diastolic BP Percentile --      Pulse Rate 10/24/23 1724 (!) 130     Resp 10/24/23 1724 20     Temp 10/24/23 1724 (!) 101.4 F (38.6 C)     Temp Source 10/24/23 1724 Oral     SpO2 10/24/23 1724 100 %     Weight 10/24/23 1732 160 lb 0.9 oz (72.6 kg)     Height 10/24/23 1732 5\' 2"  (1.575 m)     Head Circumference --      Peak Flow --      Pain Score 10/24/23 1721 10     Pain Loc --      Pain Education --      Exclude from Growth Chart --    Most recent vital signs: Vitals:   10/24/23 2230 10/24/23 2245  BP:  137/83   Pulse: (!) 117 (!) 113  Resp: 15   Temp:    SpO2: 100%    General: Awake, oriented x4. CV:  Good peripheral perfusion.  Resp:  Normal effort.  Abd:  No distention.  Other:  Middle-aged overweight Caucasian female resting comfortably in no acute distress ED Results / Procedures / Treatments  Labs (all labs ordered are listed, but only abnormal results are displayed) Labs Reviewed  COMPREHENSIVE METABOLIC PANEL WITH GFR - Abnormal; Notable for the following components:      Result Value   Sodium 133 (*)    Glucose, Bld 134 (*)    AST 175 (*)    ALT 158 (*)    Total Bilirubin 1.4 (*)    All other components within normal limits  POC URINE PREG, ED - Normal  CULTURE, BLOOD (ROUTINE X 2)  CULTURE, BLOOD (ROUTINE X 2)  LIPASE, BLOOD  CBC  CK  HIV ANTIBODY (ROUTINE TESTING W REFLEX)  LACTIC ACID, PLASMA  LACTIC ACID, PLASMA  PROTIME-INR  HEPATITIS PANEL, ACUTE  C-REACTIVE PROTEIN  URINALYSIS, W/ REFLEX TO CULTURE (INFECTION SUSPECTED)   RADIOLOGY ED MD interpretation: X-ray of the lumbar spine independently interpreted and shows  no evidence of acute abnormalities CT of the abdomen and pelvis with IV contrast independently interpreted and shows no evidence of acute abnormalities Right upper quadrant ultrasound independently interpreted and shows mild hepatic steatosis -Agree with radiology assessment Official radiology report(s): US  ABDOMEN LIMITED RUQ (LIVER/GB) Result Date: 10/24/2023 CLINICAL DATA:  Transaminitis. EXAM: ULTRASOUND ABDOMEN LIMITED RIGHT UPPER QUADRANT COMPARISON:  None Available. FINDINGS: Gallbladder: No gallstones or wall thickening visualized (2.0 mm). No sonographic Murphy sign noted by sonographer. Common bile duct: Diameter: 3.4 mm Liver: No focal lesion identified. Mildly increased echogenicity of the liver parenchyma is noted. Portal vein is patent on color Doppler imaging with normal direction of blood flow towards the liver. Other: None.  IMPRESSION: Mild hepatic steatosis. Electronically Signed   By: Virgle Grime M.D.   On: 10/24/2023 23:22   CT ABDOMEN PELVIS W CONTRAST Result Date: 10/24/2023 CLINICAL DATA:  Lower abdominal pain. EXAM: CT ABDOMEN AND PELVIS WITH CONTRAST TECHNIQUE: Multidetector CT imaging of the abdomen and pelvis was performed using the standard protocol following bolus administration of intravenous contrast. RADIATION DOSE REDUCTION: This exam was performed according to the departmental dose-optimization program which includes automated exposure control, adjustment of the mA and/or kV according to patient size and/or use of iterative reconstruction technique. CONTRAST:  100mL OMNIPAQUE IOHEXOL 300 MG/ML  SOLN COMPARISON:  None Available. FINDINGS: Lower chest: A 3 mm anterolateral right middle lobe pulmonary nodule is noted (axial CT image 2, CT series 4). Hepatobiliary: No focal liver abnormality is seen. No gallstones, gallbladder wall thickening, or biliary dilatation. Pancreas: Unremarkable. No pancreatic ductal dilatation or surrounding inflammatory changes. Spleen: Normal in size without focal abnormality. Adrenals/Urinary Tract: Adrenal glands are unremarkable. Kidneys are normal, without renal calculi, focal lesion, or hydronephrosis. Bladder is unremarkable. Stomach/Bowel: Stomach is within normal limits. Appendix appears normal. No evidence of bowel wall thickening, distention, or inflammatory changes. Vascular/Lymphatic: No significant vascular findings are present. No enlarged abdominal or pelvic lymph nodes. Reproductive: Uterus and bilateral adnexa are unremarkable. Other: No abdominal wall hernia or abnormality. No abdominopelvic ascites. Musculoskeletal: No acute or significant osseous findings. IMPRESSION: 1. No acute or active process within the abdomen or pelvis. 2. 3 mm right middle lobe pulmonary nodule. Correlation with nonemergent dedicated chest CT is recommended. Electronically Signed   By:  Virgle Grime M.D.   On: 10/24/2023 21:56   DG Lumbar Spine Complete Result Date: 10/24/2023 CLINICAL DATA:  back pain 2 weeks. EXAM: LUMBAR SPINE - COMPLETE 4+ VIEW COMPARISON:  None Available. FINDINGS: There are 5 nonrib-bearing lumbar vertebrae. Anatomic lumbar curvature. No spondylolysis or spondylolisthesis. Vertebral body heights are maintained. L4 limbus vertebra noted. No aggressive osseous lesion. Intervertebral disc heights are maintained. Minimal marginal osteophyte formation noted, most pronounced at T12-L1 level. Sacroiliac joints are symmetric. Visualized soft tissues are within normal limits. IMPRESSION: No acute osseous abnormality of the lumbar spine. Electronically Signed   By: Beula Brunswick M.D.   On: 10/24/2023 16:03   PROCEDURES: Critical Care performed: No .1-3 Lead EKG Interpretation  Performed by: Charleen Conn, MD Authorized by: Charleen Conn, MD     Interpretation: normal     ECG rate:  91   ECG rate assessment: normal     Rhythm: sinus rhythm     Ectopy: none     Conduction: normal    MEDICATIONS ORDERED IN ED: Medications  acetaminophen  (TYLENOL ) tablet 1,000 mg (1,000 mg Oral Not Given 10/24/23 2012)  ketorolac (TORADOL) 30 MG/ML injection 30 mg (30 mg Intravenous  Given 10/24/23 2012)  ondansetron  (ZOFRAN ) injection 4 mg (4 mg Intravenous Given 10/24/23 2013)  iohexol (OMNIPAQUE) 300 MG/ML solution 100 mL (100 mLs Intravenous Contrast Given 10/24/23 2139)  droperidol (INAPSINE) 2.5 MG/ML injection 2.5 mg (2.5 mg Intravenous Given 10/24/23 2243)  lactated ringers bolus 1,000 mL (1,000 mLs Intravenous New Bag/Given 10/24/23 2246)   IMPRESSION / MDM / ASSESSMENT AND PLAN / ED COURSE  I reviewed the triage vital signs and the nursing notes.                             The patient is on the cardiac monitor to evaluate for evidence of arrhythmia and/or significant heart rate changes. Patient's presentation is most consistent with acute presentation with  potential threat to life or bodily function. Patient's symptoms not typical for emergent causes of abdominal pain such as, but not limited to, appendicitis, abdominal aortic aneurysm, surgical biliary disease, pancreatitis, SBO, mesenteric ischemia, serious intra-abdominal bacterial illness. Presentation also not typical of gynecologic emergencies such as TOA, Ovarian Torsion, PID. Not Ectopic. Doubt atypical ACS.  Pt tolerating PO. Disposition: Patient will be discharged with strict return precautions and follow up with primary MD within 12-24 hours for further evaluation. Patient understands that this still may have an early presentation of an emergent medical condition such as appendicitis that will require a recheck.   FINAL CLINICAL IMPRESSION(S) / ED DIAGNOSES   Final diagnoses:  Acute bilateral low back pain with bilateral sciatica  Nausea and vomiting, unspecified vomiting type  Decreased oral intake  Dehydration   Rx / DC Orders   ED Discharge Orders          Ordered    ondansetron  (ZOFRAN -ODT) 8 MG disintegrating tablet  Every 8 hours PRN        10/24/23 2341    oxyCODONE-acetaminophen  (PERCOCET) 5-325 MG tablet  Every 4 hours PRN        10/24/23 2341           Note:  This document was prepared using Dragon voice recognition software and may include unintentional dictation errors.   Katana Berthold K, MD 10/24/23 [phone]

## 2023-10-24 NOTE — Group Note (Deleted)
 Date:  10/24/2023 Time:  9:45 PM  Group Topic/Focus:  Wrap-Up Group:   The focus of this group is to help patients review their daily goal of treatment and discuss progress on daily workbooks.     Participation Level:  {BHH PARTICIPATION EAVWU:98119}  Participation Quality:  {BHH PARTICIPATION QUALITY:22265}  Affect:  {BHH AFFECT:22266}  Cognitive:  {BHH COGNITIVE:22267}  Insight: {BHH Insight2:20797}  Engagement in Group:  {BHH ENGAGEMENT IN JYNWG:95621}  Modes of Intervention:  {BHH MODES OF INTERVENTION:22269}  Additional Comments:  ***  Maglione,Preciosa Bundrick E 10/24/2023, 9:45 PM

## 2023-10-24 NOTE — Discharge Instructions (Addendum)
-  Your liver enzymes were elevated. -You need to have more testing done so I have advised you to go to the ER.  -Hopefully you can get a CT of your back to assess for discitis since you have fever and severe back pain. -Hopefully you can get a hepatitis panel and have your liver further evaluated -Other tests that could be considered: CK (muscle enzyme elevated with rhabdo), HIV, CRP (indicates inflammation)  You have been advised to follow up immediately in the emergency department for concerning signs.symptoms. If you declined EMS transport, please have a family member take you directly to the ED at this time. Do not delay. Based on concerns about condition, if you do not follow up in th e ED, you may risk poor outcomes including worsening of condition, delayed treatment and potentially life threatening issues. If you have declined to go to the ED at this time, you should call your PCP immediately to set up a follow up appointment.  Go to ED for red flag symptoms, including; fevers you cannot reduce with Tylenol /Motrin, severe headaches, vision changes, numbness/weakness in part of the body, lethargy, confusion, intractable vomiting, severe dehydration, chest pain, breathing difficulty, severe persistent abdominal or pelvic pain, signs of severe infection (increased redness, swelling of an area), feeling faint or passing out, dizziness, etc. You should especially go to the ED for sudden acute worsening of condition if you do not elect to go at this time.

## 2023-10-24 NOTE — ED Notes (Signed)
 Pt back from CT at this time
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
## 2023-10-24 NOTE — ED Notes (Signed)
 Patient is being discharged from the Urgent Care and sent to the Emergency Department via personal vehicle . Per Nancy Axon, PA-C, patient is in need of higher level of care due to elevated liver enzymes. Patient is aware and verbalizes understanding of plan of care.  Vitals:   10/24/23 1357  BP: 132/85  Pulse: (!) 109  Resp: 18  Temp: 99.5 F (37.5 C)  SpO2: 97%

## 2023-10-24 NOTE — ED Notes (Signed)
 Pt to CT at this time.

## 2023-10-24 NOTE — ED Notes (Signed)
 Pt reporting to ED d/t multiple symptoms and complaints. Pt endorsing fever, tachycardia, lower back pain that radiates to legs, and nausea. Pt has had dark colored urine, HR to 130's initially with improvement to 110s. Pt reports decreased appetite. See triage note for further concerns.   Past Medical History:  Diagnosis Date   Anxiety    Concussion    Depression    Dyschromia    Head ache    Migraine

## 2023-10-24 NOTE — ED Notes (Signed)
US at the bedside at this time

## 2023-10-24 NOTE — ED Provider Notes (Signed)
 [facility]    CSN: [id number] Arrival date & time: 10/24/23  1318      History   Chief Complaint Chief Complaint  Patient presents with   Back Pain    HPI Keryl I Tabora is a 28 y.o. female for multiple complaints.   Patient reports low back pain x 2 weeks. States pain radiates to legs and they sometimes feel numb when she sits for a while or moves a certain way. Also reports swollen lymph nodes in her neck x 1 week. Reports congestion, but denies sore throat--hx of tonsillectomy. States she has had fevers most days over the past 1 week. States she checked her temp on Sunday and it was 100.6 and was 101 degrees yesterday. Reports dark urine x 4 days. Denies dysuria, urgency or frequency. No vaginal discharge or odor. Treated for BV and yeast a couple weeks ago. Reports intermittent LUQ pain, but really only when the abdomen is palpated. Vomiting at onset and reduced appetite over the past 6 days. Reports weight loss. No chest pain or SOB. Denies any joint swelling at this time.   Patient seen by PCP 1 week ago for back pain and enlarged lymph nodes. Advised Ibuprofen, tylenol , voltaren gel, lidocaine patches, stretches and consider PT. Patient denies having workup at PCP office and feels like they did not take her complaints seriously.  History of amplified muscular pain syndrome.   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   Cervical  disc disorder with radiculopathy of cervical region 04/21/2021   Pain management contract signed 04/14/2021   Headache disorder 02/03/2021   Nausea 02/03/2021   Post concussion syndrome 02/03/2021   Neck pain 02/03/2021   Left-sided weakness 12/23/2020   Numbness 12/23/2020   Moderate episode of recurrent major depressive disorder (HCC) 10/06/2020   History of 2019 novel coronavirus disease (COVID-19) 05/20/2020   ADHD 03/03/2019   Insomnia 12/20/2018   Vitamin D  deficiency 03/20/2018   Migraine 02/09/2018   Depression 02/09/2018   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      Home Medications    Prior to Admission medications   Medication Sig Start Date End Date Taking? Authorizing Provider  celecoxib (CELEBREX) 200 MG capsule  11/05/22  Yes [provider]  diclofenac (VOLTAREN) 75 MG EC tablet  07/11/22  Yes [provider]  diclofenac Sodium (VOLTAREN) 1 % GEL Apply 4 g topically. 10/17/23  Yes [provider]  dronabinol (MARINOL) 10 MG capsule Take 1 PO BID PRN, at 30 minutes before a meal. 01/07/22  Yes [provider]  dronabinol (MARINOL) 5 MG capsule  06/18/21  Yes [provider]  DULoxetine  (  CYMBALTA ) 30 MG capsule  08/27/21  Yes [provider]  ferrous sulfate (FEROSUL) 325 (65 FE) MG tablet  08/30/21  Yes [provider]  Galcanezumab-gnlm (EMGALITY) 120 MG/ML SOAJ Inject 240 mg into the skin. 09/14/21  Yes [provider]  HYDROcodone -acetaminophen  (NORCO/VICODIN) 5-325 MG tablet  06/28/22  Yes [provider]  hydrOXYzine (ATARAX) 25 MG tablet Take 25 mg by mouth. 09/05/23  Yes [provider]  lacosamide (VIMPAT) 50 MG TABS tablet  04/06/22  Yes [provider]  lidocaine  (LIDODERM) 5 % Place 1 patch onto the skin. 10/17/23 10/16/24 Yes [provider]  linaclotide Glory Larsen) 145 MCG CAPS capsule  04/15/22  Yes [provider]  meloxicam (MOBIC) 15 MG tablet  08/10/22  Yes [provider]  naltrexone (DEPADE) 50 MG tablet Take 25 mg by mouth. 05/23/22 12/21/23 Yes [provider]  acetaminophen  (TYLENOL ) 325 MG tablet Take by mouth as needed.    [provider]  amitriptyline (ELAVIL) 10 MG tablet     [provider]  benzonatate  (TESSALON ) 200 MG capsule Take by mouth.    [provider]  cyclobenzaprine  (FLEXERIL ) 5 MG tablet Take 5 mg by mouth at bedtime as needed. 03/01/21   [provider]  Erenumab -aooe (AIMOVIG ) 140 MG/ML SOAJ  02/12/20   [provider]  fluconazole  (DIFLUCAN ) 150 MG tablet Take 150 mg by mouth once.    [provider]  gabapentin  (NEURONTIN ) 300 MG capsule Take 1 capsule (300 mg total) by mouth 3 (three) times daily as needed. 10/06/20   Aileen Alexanders, NP  metroNIDAZOLE (FLAGYL) 500 MG tablet Take 500 mg by mouth 2 (two) times daily.    [provider]  naproxen  (NAPROSYN ) 500 MG tablet Take 1 tablet (500 mg total) by mouth 2 (two) times daily with a meal. 06/28/22   Triplett, Cari B, FNP  nortriptyline  (PAMELOR ) 50 MG capsule  11/04/20   [provider]  ondansetron  (ZOFRAN ) 4 MG tablet  11/04/20   [provider]  prochlorperazine (COMPAZINE) 5 MG tablet Take by mouth. 04/13/21   [provider]  promethazine  (PHENERGAN ) 25 MG suppository Place 1 suppository (25 mg total) rectally every 6 (six) hours as needed for nausea or vomiting. 01/15/21   Aileen Alexanders, NP  rizatriptan  (MAXALT ) 10 MG tablet Take 1 tab at first sign of migraine. May repeat in 2 hours if needed 10/06/20   Aileen Alexanders, NP  topiramate  (TOPAMAX ) 100 MG tablet TAKE 1 TABLET(100 MG) BY MOUTH TWICE DAILY 08/30/21   Aileen Alexanders, NP  traZODone  (DESYREL) 50 MG tablet Take 50 mg by mouth at bedtime. 04/13/21   [provider]  valACYclovir  (VALTREX ) 1000 MG tablet Take 1 tablet (1,000 mg total) by mouth 2 (two) times daily. Patient taking differently: Take 1,000 mg by mouth as needed. 03/14/19   Corbin Dess, PA-C  Vitamin D , Ergocalciferol , (DRISDOL ) 1.25 MG (50000 UNIT) CAPS capsule Take 1 capsule (50,000 Units total) by mouth every 7 (seven) days. 01/18/21   Solomon Dupre, DO    Family History Family History  Problem Relation Age of Onset   Chronic fatigue Mother    Fibromyalgia Mother    Thyroid  disease Mother    Congenital heart disease Mother    Prostate cancer Father    Heart murmur Brother    Autism Brother    Asthma Brother    Autism Brother    Asperger's syndrome Brother    Hypertension Maternal Grandmother  Hyperlipidemia Maternal Grandmother    Cancer Maternal Grandfather     Social History Social History   Tobacco Use   Smoking status: Former    Passive exposure: Current   Smokeless tobacco: Never  Vaping Use   Vaping status: Every Day   Substances: Nicotine, THC  Substance Use Topics   Alcohol use: Yes    Comment: occ (2x/wk)   Drug use: Yes    Types: Marijuana     Allergies   Sulfa antibiotics, Oxcarbazepine, and Wellbutrin  [bupropion ]   Review of Systems Review of Systems  Constitutional:  Positive for appetite change, fatigue, fever and unexpected weight change. Negative for chills and diaphoresis.  HENT:  Positive for congestion. Negative for ear pain, rhinorrhea and sore throat.   Respiratory:  Negative for cough and shortness of breath.   Cardiovascular:  Negative for chest pain and palpitations.  Gastrointestinal:  Positive for abdominal pain, nausea and vomiting. Negative for blood in stool, constipation and diarrhea.  Genitourinary:  Positive for flank pain. Negative for difficulty urinating, dysuria, frequency, pelvic pain and vaginal bleeding.   Musculoskeletal:  Positive for back pain and myalgias. Negative for arthralgias.  Skin:  Negative for rash.  Neurological:  Positive for numbness. Negative for dizziness, syncope, weakness and headaches.  Hematological:  Negative for adenopathy.     Physical Exam Triage Vital Signs ED Triage Vitals  Encounter Vitals Group     BP 10/24/23 1357 132/85     Systolic BP Percentile --      Diastolic BP Percentile --      Pulse Rate 10/24/23 1357 (!) 109     Resp 10/24/23 1357 18     Temp 10/24/23 1357 99.5 F (37.5 C)     Temp Source 10/24/23 1357 Oral     SpO2 10/24/23 1357 97 %     Weight 10/24/23 1356 160 lb 0.9 oz (72.6 kg)     Height --      Head Circumference --      Peak Flow --      Pain Score 10/24/23 1355 8     Pain Loc --      Pain Education --      Exclude from Growth Chart --    No data found.  Updated Vital Signs BP 132/85 (BP Location: Right Arm)   Pulse (!) 109   Temp 99.5 F (37.5 C) (Oral)   Resp 18   Wt 160 lb 0.9 oz (72.6 kg)   LMP 09/25/2023 (Approximate)   SpO2 97%   BMI 30.24 kg/m       Physical Exam Vitals and nursing note reviewed.  Constitutional:      General: She is not in acute distress.    Appearance: Normal appearance. She is not ill-appearing or toxic-appearing.     Comments: Patient appears fatigued.  HENT:     Head: Normocephalic and atraumatic.     Nose: Nose normal.     Mouth/Throat:     Mouth: Mucous membranes are moist.     Pharynx: Oropharynx is clear.  Eyes:     General: No scleral icterus.       Right eye: No discharge.        Left eye: No discharge.     Conjunctiva/sclera: Conjunctivae normal.  Cardiovascular:     Rate and Rhythm: Regular rhythm. Tachycardia present.     Heart sounds: Normal heart sounds.  Pulmonary:     Effort: Pulmonary effort is normal. No respiratory distress.  Breath sounds: Normal breath sounds.  Abdominal:     Palpations: Abdomen is soft.     Tenderness: There is abdominal tenderness  (LUQ). There is right CVA tenderness and left CVA tenderness.  Musculoskeletal:     Cervical back: Neck supple.     Comments: Diffuse TTP lower thoracic vertebra/lumbar vertebra and bilateral lower thoracic and lumbar paravertebral muscles. Reduced ROM of back  Lymphadenopathy:     Cervical: Cervical adenopathy present.  Skin:    General: Skin is dry.  Neurological:     General: No focal deficit present.     Mental Status: She is alert and oriented to person, place, and time. Mental status is at baseline.     Motor: No weakness.     Coordination: Coordination normal.     Gait: Gait normal.  Psychiatric:        Mood and Affect: Mood normal.        Behavior: Behavior normal.      UC Treatments / Results  Labs (all labs ordered are listed, but only abnormal results are displayed) Labs Reviewed  URINALYSIS, W/ REFLEX TO CULTURE (INFECTION SUSPECTED) - Abnormal; Notable for the following components:      Result Value   Hgb urine dipstick TRACE (*)    Bacteria, UA MANY (*)    All other components within normal limits  COMPREHENSIVE METABOLIC PANEL WITH GFR - Abnormal; Notable for the following components:   Sodium 132 (*)    Glucose, Bld 125 (*)    Total Protein 8.2 (*)    AST 156 (*)    ALT 146 (*)    All other components within normal limits  GROUP A STREP BY PCR  PREGNANCY, URINE  CBC WITH DIFFERENTIAL/PLATELET  MONONUCLEOSIS SCREEN  ROCKY MTN SPOTTED FVR ABS PNL(IGG+IGM)  LYME DISEASE SEROLOGY W/REFLEX  EPSTEIN-BARR VIRUS (EBV) ANTIBODY PROFILE    EKG   Radiology DG Lumbar Spine Complete Result Date: 10/24/2023 CLINICAL DATA:  back pain 2 weeks. EXAM: LUMBAR SPINE - COMPLETE 4+ VIEW COMPARISON:  None Available. FINDINGS: There are 5 nonrib-bearing lumbar vertebrae. Anatomic lumbar curvature. No spondylolysis or spondylolisthesis. Vertebral body heights are maintained. L4 limbus vertebra noted. No aggressive osseous lesion. Intervertebral disc heights are maintained.  Minimal marginal osteophyte formation noted, most pronounced at T12-L1 level. Sacroiliac joints are symmetric. Visualized soft tissues are within normal limits. IMPRESSION: No acute osseous abnormality of the lumbar spine. Electronically Signed   By: Beula Brunswick M.D.   On: 10/24/2023 16:03    Procedures Procedures (including critical care time)  Medications Ordered in UC Medications - No data to display  Initial Impression / Assessment and Plan / UC Course  I have reviewed the triage vital signs and the nursing notes.  Pertinent labs & imaging results that were available during my care of the patient were reviewed by me and considered in my medical decision making (see chart for details).   28 year old female presents for numerous complaints.  Over the past 2 weeks she has had severe worsening bilateral central lower back pain with occasional radiation to lower extremities and numbness in the lower extremities when sitting for too long.  Over the past week she has developed fever, fatigue, weight loss, loss of appetite, swollen lymph nodes of neck, dark urine and pain of the left upper abdomen.  Saw PCP a week ago and did not have any sort of workup.  Thought to have musculoskeletal back pain advised supportive care with use of OTC meds.  Patient reports symptoms only worsened.  Chart review from PCP visit 1 week ago.  No workup performed.  Pulse is elevated at 109 bpm.  Afebrile.  Current temp 99.5 degrees.  Has been taking antipyretics.  On exam has tenderness of the left upper quadrant and bilateral CVA tenderness.  Additionally has tenderness throughout the lower thoracic vertebra and all lumbar vertebra and bilateral lower thoracic paravertebral and bilateral lumbar paravertebral muscles.  Reduced range of motion.    DDX: mono, strep, musculoskeletal pain, pyelonephritis, kidney stones, rhabdo, acute kidney injury, splenic infarct, gastritis, GERD, tickborne illness, hepatitis (viral,  autoimmune), flare up of chronic muscle pain condition, liver injury, viral illness  Negative pregnancy.  Negative strep.  Negative mono.  Urinalysis shows trace hemoglobin and many bacteria.  Not consistent with UTI.  Lower suspicion for pyelonephritis.  CBC is positive for abnormal lymphocytes, otherwise normal.  No WBC count elevation.  CMP with elevated AST and ALT of 156 and 146.  L spine xray obtained. Negative  Review of all labs with patient.  Expressed my concerns for elevated liver enzymes. This could reflect hepatitis, liver injury or just a viral illness.  We also discussed concern for possible discitis given the fever and severe lower back pain.  She was encouraged to follow-up in emergency department.  I suspect patient needs CT imaging of back to evaluate for possible discitis.  Also likely needs further workup for hepatitis and other potential causes of liver injury versus splenic enlargement.  Patient will proceed to [facility] as she is leaving in stable condition at this time.  Undiagnosed new condition with uncertain prognosis.   Final Clinical Impressions(s) / UC Diagnoses   Final diagnoses:  Acute bilateral low back pain, unspecified whether sciatica present  Fever, unspecified  Elevated liver enzymes  Other fatigue  Unintentional weight loss     Discharge Instructions      -Your liver enzymes were elevated. -You need to have more testing done so I have advised you to go to the ER.  -Hopefully you can get a CT of your back to assess for discitis since you have fever and severe back pain. -Hopefully you can get a hepatitis panel and have your liver further evaluated -Other tests that could be considered: CK (muscle enzyme elevated with rhabdo), HIV, CRP (indicates inflammation)  You have been advised to follow up immediately in the emergency department for concerning signs.symptoms. If you declined EMS transport, please have a family member take you directly to the  ED at this time. Do not delay. Based on concerns about condition, if you do not follow up in th e ED, you may risk poor outcomes including worsening of condition, delayed treatment and potentially life threatening issues. If you have declined to go to the ED at this time, you should call your PCP immediately to set up a follow up appointment.  Go to ED for red flag symptoms, including; fevers you cannot reduce with Tylenol /Motrin, severe headaches, vision changes, numbness/weakness in part of the body, lethargy, confusion, intractable vomiting, severe dehydration, chest pain, breathing difficulty, severe persistent abdominal or pelvic pain, signs of severe infection (increased redness, swelling of an area), feeling faint or passing out, dizziness, etc. You should especially go to the ED for sudden acute worsening of condition if you do not elect to go at this time.    ED Prescriptions   None    PDMP not reviewed this encounter.   Floydene Hy, PA-C 10/24/23 1932

## 2023-10-24 NOTE — ED Triage Notes (Signed)
 Pt presents with mid lower back pain x 2 weeks. Pt also c/o swollen lymph nodes x 1 week. Pt also c/o dark urine x 5 days. Pt says her urine is not consistently dark but mostly when she wakes up in the morning.

## 2023-10-25 LAB — URINALYSIS, W/ REFLEX TO CULTURE (INFECTION SUSPECTED)
Bacteria, UA: NONE SEEN
Bilirubin Urine: NEGATIVE
Glucose, UA: NEGATIVE mg/dL
Ketones, ur: NEGATIVE mg/dL
Leukocytes,Ua: NEGATIVE
Nitrite: NEGATIVE
Protein, ur: NEGATIVE mg/dL
Specific Gravity, Urine: >1.046 — ABNORMAL HIGH (ref 1.005–1.030)
pH: 7 (ref 5.0–8.0)

## 2023-10-25 LAB — HEPATITIS PANEL, ACUTE
HCV Ab: NONREACTIVE
Hep A IgM: NONREACTIVE
Hep B C IgM: NONREACTIVE
Hepatitis B Surface Ag: NONREACTIVE

## 2023-10-25 LAB — C-REACTIVE PROTEIN: CRP: 5.2 mg/dL — ABNORMAL HIGH (ref <1.0)

## 2023-10-25 NOTE — ED Notes (Signed)
 Pt nausea improved. Mild back pain present. Pt ABCs intact. RR even and unlabored. Pt in NAD. Bed in lowest locked position. Call bell in reach. Denies needs at this time.

## 2023-10-25 NOTE — ED Notes (Signed)
 Pt discharged to ED circle at this time and left with all belongings. Pt ABCs intact. RR even and unlabored. Pt in NAD. Pt denies further needs from this RN.

## 2023-10-25 NOTE — ED Notes (Signed)
 RN made aware of pt continued nausea and 1 occurrence of emesis. MD made aware and requested additional nausea medication. Pt ABCs intact. RR even and unlabored. Pt still looks uncomfortable but improving.

## 2023-10-26 ENCOUNTER — Ambulatory Visit (HOSPITAL_COMMUNITY): Payer: Self-pay

## 2023-10-26 LAB — EPSTEIN-BARR VIRUS (EBV) ANTIBODY PROFILE
EBV NA IgG: 275 U/mL — ABNORMAL HIGH (ref 0.0–17.9)
EBV VCA IgG: >600 U/mL — ABNORMAL HIGH (ref 0.0–17.9)
EBV VCA IgM: 63.7 U/mL — ABNORMAL HIGH (ref 0.0–35.9)

## 2023-10-26 LAB — LYME DISEASE SEROLOGY W/REFLEX: Lyme Total Antibody EIA: NEGATIVE

## 2023-10-29 LAB — CULTURE, BLOOD (ROUTINE X 2)
Culture: NO GROWTH
Culture: NO GROWTH
Special Requests: ADEQUATE

## 2023-11-14 DIAGNOSIS — N920 Excessive and frequent menstruation with regular cycle: Secondary | ICD-10-CM | POA: Diagnosis not present

## 2023-11-14 DIAGNOSIS — F3341 Major depressive disorder, recurrent, in partial remission: Secondary | ICD-10-CM | POA: Diagnosis not present

## 2023-11-14 DIAGNOSIS — R7401 Elevation of levels of liver transaminase levels: Secondary | ICD-10-CM | POA: Diagnosis not present

## 2023-11-14 DIAGNOSIS — Z8619 Personal history of other infectious and parasitic diseases: Secondary | ICD-10-CM | POA: Diagnosis not present

## 2023-11-14 DIAGNOSIS — Z8639 Personal history of other endocrine, nutritional and metabolic disease: Secondary | ICD-10-CM | POA: Diagnosis not present

## 2023-11-14 DIAGNOSIS — G894 Chronic pain syndrome: Secondary | ICD-10-CM | POA: Diagnosis not present

## 2023-11-14 DIAGNOSIS — G4700 Insomnia, unspecified: Secondary | ICD-10-CM | POA: Diagnosis not present

## 2023-11-14 DIAGNOSIS — F458 Other somatoform disorders: Secondary | ICD-10-CM | POA: Diagnosis not present

## 2023-11-14 DIAGNOSIS — K219 Gastro-esophageal reflux disease without esophagitis: Secondary | ICD-10-CM | POA: Diagnosis not present

## 2023-11-14 DIAGNOSIS — M5010 Cervical disc disorder with radiculopathy, unspecified cervical region: Secondary | ICD-10-CM | POA: Diagnosis not present

## 2023-11-14 DIAGNOSIS — R110 Nausea: Secondary | ICD-10-CM | POA: Diagnosis not present

## 2023-11-14 DIAGNOSIS — G43009 Migraine without aura, not intractable, without status migrainosus: Secondary | ICD-10-CM | POA: Diagnosis not present

## 2023-12-21 DIAGNOSIS — M25561 Pain in right knee: Secondary | ICD-10-CM | POA: Diagnosis not present

## 2023-12-21 DIAGNOSIS — M2391 Unspecified internal derangement of right knee: Secondary | ICD-10-CM | POA: Diagnosis not present

## 2024-04-01 DIAGNOSIS — R42 Dizziness and giddiness: Secondary | ICD-10-CM | POA: Diagnosis not present

## 2024-04-01 DIAGNOSIS — R200 Anesthesia of skin: Secondary | ICD-10-CM | POA: Diagnosis not present

## 2024-04-16 DIAGNOSIS — R202 Paresthesia of skin: Secondary | ICD-10-CM | POA: Diagnosis not present
